# Patient Record
Sex: Female | Born: 1975 | Hispanic: Yes | Marital: Married | Smoking: Never smoker
Health system: Southern US, Community
[De-identification: ages and names within clinical notes are randomized; demographics above are authoritative.]

## PROBLEM LIST (undated history)

## (undated) DIAGNOSIS — E663 Overweight: Principal | ICD-10-CM

## (undated) DIAGNOSIS — N920 Excessive and frequent menstruation with regular cycle: Secondary | ICD-10-CM

## (undated) DIAGNOSIS — K5900 Constipation, unspecified: Secondary | ICD-10-CM

## (undated) DIAGNOSIS — R0602 Shortness of breath: Secondary | ICD-10-CM

## (undated) DIAGNOSIS — Z8619 Personal history of other infectious and parasitic diseases: Secondary | ICD-10-CM

## (undated) DIAGNOSIS — F411 Generalized anxiety disorder: Secondary | ICD-10-CM

## (undated) DIAGNOSIS — Z833 Family history of diabetes mellitus: Secondary | ICD-10-CM

## (undated) DIAGNOSIS — R002 Palpitations: Secondary | ICD-10-CM

## (undated) DIAGNOSIS — R079 Chest pain, unspecified: Secondary | ICD-10-CM

## (undated) DIAGNOSIS — I7300 Raynaud's syndrome without gangrene: Principal | ICD-10-CM

## (undated) DIAGNOSIS — Z0000 Encounter for general adult medical examination without abnormal findings: Secondary | ICD-10-CM

## (undated) DIAGNOSIS — M545 Low back pain: Secondary | ICD-10-CM

## (undated) DIAGNOSIS — S161XXA Strain of muscle, fascia and tendon at neck level, initial encounter: Secondary | ICD-10-CM

## (undated) DIAGNOSIS — E785 Hyperlipidemia, unspecified: Secondary | ICD-10-CM

## (undated) HISTORY — DX: Low back pain: M54.5

## (undated) HISTORY — DX: Strain of muscle, fascia and tendon at neck level, initial encounter: S16.1XXA

## (undated) HISTORY — DX: Raynaud's syndrome without gangrene: I73.00

## (undated) HISTORY — DX: Generalized anxiety disorder: F41.1

## (undated) HISTORY — DX: Family history of diabetes mellitus: Z83.3

## (undated) HISTORY — DX: Encounter for general adult medical examination without abnormal findings: Z00.00

## (undated) HISTORY — DX: Personal history of other infectious and parasitic diseases: Z86.19

## (undated) HISTORY — DX: Excessive and frequent menstruation with regular cycle: N92.0

## (undated) HISTORY — DX: Hyperlipidemia, unspecified: E78.5

## (undated) HISTORY — DX: Overweight: E66.3

## (undated) HISTORY — DX: Chest pain, unspecified: R07.9

## (undated) HISTORY — DX: Constipation, unspecified: K59.00

## (undated) HISTORY — PX: TUBAL LIGATION: SHX77

## (undated) HISTORY — DX: Shortness of breath: R06.02

## (undated) HISTORY — DX: Palpitations: R00.2

---

## 2002-08-16 ENCOUNTER — Other Ambulatory Visit: Admission: RE | Admit: 2002-08-16 | Discharge: 2002-08-16 | Payer: Self-pay | Admitting: *Deleted

## 2003-06-08 ENCOUNTER — Other Ambulatory Visit: Admission: RE | Admit: 2003-06-08 | Discharge: 2003-06-08 | Payer: Self-pay | Admitting: Obstetrics & Gynecology

## 2003-12-04 ENCOUNTER — Inpatient Hospital Stay (HOSPITAL_COMMUNITY): Admission: RE | Admit: 2003-12-04 | Discharge: 2003-12-07 | Payer: Self-pay | Admitting: Obstetrics & Gynecology

## 2003-12-08 ENCOUNTER — Encounter: Admission: RE | Admit: 2003-12-08 | Discharge: 2004-01-07 | Payer: Self-pay | Admitting: Obstetrics & Gynecology

## 2004-12-26 ENCOUNTER — Ambulatory Visit: Payer: Self-pay | Admitting: Internal Medicine

## 2005-05-01 ENCOUNTER — Other Ambulatory Visit: Admission: RE | Admit: 2005-05-01 | Discharge: 2005-05-01 | Payer: Self-pay | Admitting: Otolaryngology

## 2005-05-12 ENCOUNTER — Encounter: Admission: RE | Admit: 2005-05-12 | Discharge: 2005-05-12 | Payer: Self-pay | Admitting: Otolaryngology

## 2005-05-20 ENCOUNTER — Encounter: Admission: RE | Admit: 2005-05-20 | Discharge: 2005-05-20 | Payer: Self-pay | Admitting: Otolaryngology

## 2005-08-23 ENCOUNTER — Emergency Department (HOSPITAL_COMMUNITY): Admission: EM | Admit: 2005-08-23 | Discharge: 2005-08-23 | Payer: Self-pay | Admitting: Family Medicine

## 2005-10-08 ENCOUNTER — Emergency Department (HOSPITAL_COMMUNITY): Admission: EM | Admit: 2005-10-08 | Discharge: 2005-10-08 | Payer: Self-pay | Admitting: Family Medicine

## 2005-10-29 ENCOUNTER — Emergency Department (HOSPITAL_COMMUNITY): Admission: EM | Admit: 2005-10-29 | Discharge: 2005-10-29 | Payer: Self-pay | Admitting: Family Medicine

## 2005-11-14 ENCOUNTER — Emergency Department (HOSPITAL_COMMUNITY): Admission: EM | Admit: 2005-11-14 | Discharge: 2005-11-14 | Payer: Self-pay | Admitting: Family Medicine

## 2007-04-23 ENCOUNTER — Inpatient Hospital Stay (HOSPITAL_COMMUNITY): Admission: AD | Admit: 2007-04-23 | Discharge: 2007-04-23 | Payer: Self-pay | Admitting: *Deleted

## 2007-05-10 ENCOUNTER — Encounter (INDEPENDENT_AMBULATORY_CARE_PROVIDER_SITE_OTHER): Payer: Self-pay | Admitting: Obstetrics & Gynecology

## 2007-05-10 ENCOUNTER — Inpatient Hospital Stay (HOSPITAL_COMMUNITY): Admission: RE | Admit: 2007-05-10 | Discharge: 2007-05-13 | Payer: Self-pay | Admitting: Obstetrics & Gynecology

## 2007-05-14 ENCOUNTER — Encounter: Admission: RE | Admit: 2007-05-14 | Discharge: 2007-06-08 | Payer: Self-pay | Admitting: Obstetrics & Gynecology

## 2008-07-11 ENCOUNTER — Emergency Department (HOSPITAL_COMMUNITY): Admission: EM | Admit: 2008-07-11 | Discharge: 2008-07-11 | Payer: Self-pay | Admitting: Family Medicine

## 2010-07-30 NOTE — Op Note (Signed)
NAMEREBECCA, Casey     ACCOUNT NO.:  [id number]   MEDICAL RECORD NO.:  [id number]          PATIENT TYPE:  INP   LOCATION:  9135                          [facility]  [facility]   PHYSICIAN:  Genia Del, M.D.DATE OF BIRTH:  1975-07-02   DATE OF PROCEDURE:  05/10/2007  DATE OF DISCHARGE:                               OPERATIVE REPORT   PREOPERATIVE DIAGNOSES:  1. Thirty-nine weeks' gestation.  2. Previous cesarean section.  3. Desire for bilateral tubal sterilization.   POSTOPERATIVE DIAGNOSES:  1. Thirty-nine weeks' gestation.  2. Previous cesarean section.  3. Desire for bilateral tubal sterilization.   PROCEDURES:  1. Repeat low transverse cesarean section.  2. Bilateral tubal sterilization by modified Pomeroy technique.   SURGEON:  Genia Del, M.D.   ASSISTANT:  Marlinda Mike, C.N.M.   PROCEDURE:  Under spinal anesthesia, the patient is in 15-degree left  decubitus position.  She is prepped with Betadine on the abdominal,  suprapubic, vulvar and vaginal areas and draped as usual.  The Foley is  put in place.  We verify the level of anesthesia, which is adequate.  We  then infiltrate the skin with Marcaine 0.25% 20 mL total at the site of  the previous scar.  We make a Pfannenstiel incision at that site with a  scalpel.  We open the adipose tissue with a scalpel and then with the  electrocautery.  We open the aponeurosis with the electrocautery and the  Mayo scissors transversely.  We then separate the aponeurosis from the  rectus muscles on the midline superiorly and inferiorly.  We open the  parietal peritoneum longitudinally with [facility] scissors.  We note that the  bladder is very adherent over the lower uterine segment.  We proceed  with meticulous dissection with Metz scissors and lower the bladder this  way.  We then put the bladder retractor in place.  We proceed with a low  transverse hysterotomy with the scalpel.  We extend on each side with  dressing scissors.  The amniotic fluid is clear.  The fetus is in  cephalic presentation.  Birth of a baby girl at 25.  The cord is  clamped and cut.  The baby is given to the neonatal team after  suctioning.  Apgars are 9 and 9.  Weight is 8 pounds 6 ounces.  We then  remove the placenta manually.  The placenta is sent to labor and  delivery after cord blood banking is done.  We do a uterine revision.  We then close the hysterotomy with a full-thickness locked running  suture of Vicryl 0.  We do a second layer to complete hemostasis on the  midline in a mattress fashion with Vicryl 0.  That complete hemostasis.  We then proceed with the bilateral tubal sterilization.  We start on the  right side.  We make a window in the mesosalpinx with the  electrocautery.  We use plain free tie to ligate the tube at about 2 cm  from the cornua and then another tie distally from that.  Was we then  section the portion of the tube in between the two ties with [employer]  and send it to pathology.  We use electrocautery to cauterize  each cut and of the tube.  We proceed exactly the same way on the left  tube.  Hemostasis is adequate at both levels.  We inspect the uterus and  ovaries.  They are normal in size and appearance.  We then irrigate and  suction the abdominopelvic cavities.  We complete hemostasis with the  electrocautery on the recti muscles where necessary.  We then close the  aponeurosis with two half running sutures of Vicryl 0.  The count of  instruments and sponges was complete x2.  We complete hemostasis on the  adipose tissue with the electrocautery and reapproximate the skin with  staples.  A compressive dressing is applied.  The estimated blood loss  was 800 mL.  No complications occurred and the patient was brought to  recovery room in good, stable status.      Genia Del, M.D.  Electronically Signed     ML/MEDQ  D:  05/10/2007  T:  05/11/2007  Job:  [id number]

## 2010-07-30 NOTE — Discharge Summary (Signed)
NAMESHAKOYA, GILMORE     ACCOUNT NO.:  [id number]   MEDICAL RECORD NO.:  [id number]          PATIENT TYPE:  INP   LOCATION:  9135                          [facility]   PHYSICIAN:  Genia Del, M.D.DATE OF BIRTH:  Jan 22, 1976   DATE OF ADMISSION:  05/10/2007  DATE OF DISCHARGE:  05/13/2007                               DISCHARGE SUMMARY   ADMISSION DIAGNOSES:  1. [redacted] weeks gestation.  2. Previous cesarean section.  3. Desire for bilateral tubal sterilization.   DISCHARGE DIAGNOSES:  1. [redacted] weeks gestation.  2. Previous cesarean section.  3. Desire for bilateral tubal sterilization.  4. Birth of a baby girl by cesarean section.   INTERVENTION:  Repeat low-transverse C-section and bilateral tubal  sterilization by modified Pomeroy technique.   PROCEDURE:  No complication.   POSTOPERATIVE EVALUATION:  Normal.  The patient remained afebrile and  hemodynamically stable.  Postop hemoglobin was 10.2 with a hematocrit of  29.  She was discharged on postop day #3 in stable status.  Postop  advice were given.  She will follow up at [facility] in [facility] weeks.  Pain medications were given.      Genia Del, M.D.  Electronically Signed     ML/MEDQ  D:  06/03/2007  T:  06/04/2007  Job:  [id number]

## 2010-07-30 NOTE — H&P (Signed)
Maria Casey, Maria Casey     ACCOUNT NO.:  [id number]   MEDICAL RECORD NO.:  [id number]          PATIENT TYPE:  MAT   LOCATION:  MATC                          [facility]:  [facility]   PHYSICIAN:  [place] B. Earlene Plater, M.D.  DATE OF BIRTH:  [birth year]   DATE OF ADMISSION:  04/23/2007  DATE OF DISCHARGE:                              HISTORY & PHYSICAL   CHIEF COMPLAINT:  Left flank pain.   HISTORY OF PRESENT ILLNESS:  A 35 year old, white female, G3,  P2, 36  weeks presents with approximately 4 hours of intermittent left flank  pain. It would last about 10 minutes at a time, colicky in nature, no  associated urinary symptoms.  No fever, nausea, vomiting, diarrhea or  constipation, good fetal movement.  The patient is unsure if she is  feeling contractions as she has had two previous cesarean sections and  never labored.  Reports good fetal activity.  No vaginal bleeding or  leakage of fluid.   PAST MEDICAL HISTORY:  Fibroids.   PAST SURGICAL HISTORY:  C-section x2.   SOCIAL HISTORY:  Noncontributory.   MEDICATIONS:  Prenatal vitamins.   ALLERGIES:  None.   SOCIAL/FAMILY HISTORY:  Noncontributory.   REVIEW OF SYSTEMS:  Otherwise negative.   PHYSICAL EXAM:  VITAL SIGNS:  Temperature 98.3, blood pressure 111/72,  pulse 96, respirations 20.  GENERAL:  The patient is alert and oriented in no acute distress,  currently free of pain.  The fetal heart rate monitor shows fetal heart  rate baseline in the 140s, moderate variability, no decelerations and  reactive pattern.  There were intermittent contractions when the patient  was initially on the monitor.  At that time, she was having the pain.  At this point, there are no contractions tracing and the patient is free  of pain.  ABDOMEN:  Soft, nontender.  Uterus is nontender.  BACK:  No CVA tenderness.  PELVIC:  Normal external genitalia, vagina normal, cervix is closed,  fixed and effaced and high presenting part.   LABORATORY  ASSESSMENT:  Urinalysis within normal limits.  CBC shows a  white count of 8.5, hemoglobin 13.5, platelets 170. Differentials within  normal limits. Basic metabolic profile within normal limits.  Renal  ultrasound shows normal sized kidneys. No evidence for stones. Bilateral  ureteral jets are seen.   ASSESSMENT:  Left flank pain, possibly the patient was feeling  contractions earlier as she was having pain at that point and  contractions were tracing on the monitor then. At this time, she is  painfree and there are no contractions.  Furthermore, there is no evidence on laboratory or radiological  evaluation for nephrolithiasis, however this is not completely ruled out  as a small stone may have already passed or might not be visualized. The  patient is instructed to monitor symptoms, notify if they recur and  otherwise followup in the office next week.      Gerri Spore B. Earlene Plater, M.D.  Electronically Signed     WBD/MEDQ  D:  04/23/2007  T:  04/24/2007  Job:  [id number]

## 2010-09-24 ENCOUNTER — Other Ambulatory Visit: Payer: Self-pay | Admitting: Obstetrics & Gynecology

## 2010-12-06 LAB — CBC
HCT: 29 — ABNORMAL LOW
HCT: 38.7
Hemoglobin: 10.2 — ABNORMAL LOW
Hemoglobin: 13.6
MCHC: 34.7
MCHC: 35.3
MCV: 92.1
Platelets: 170
Platelets: 114 — ABNORMAL LOW
RBC: 3.13 — ABNORMAL LOW
RBC: 4.2

## 2010-12-06 LAB — DIFFERENTIAL
Basophils Absolute: 0
Eosinophils Relative: 1
Lymphs Abs: 2.8
Monocytes Relative: 6
Neutro Abs: 5.2
Neutrophils Relative %: 61

## 2010-12-06 LAB — URINALYSIS, ROUTINE W REFLEX MICROSCOPIC
Glucose, UA: NEGATIVE
Urobilinogen, UA: 0.2

## 2010-12-06 LAB — BASIC METABOLIC PANEL
BUN: 7
CO2: 27
Calcium: 9.1
Chloride: 98
Creatinine, Ser: 0.5
GFR calc Af Amer: >60
Potassium: 3.9
Sodium: 133 — ABNORMAL LOW

## 2010-12-06 LAB — RPR: RPR Ser Ql: NONREACTIVE

## 2010-12-06 LAB — CCBB MATERNAL DONOR DRAW

## 2011-10-11 ENCOUNTER — Emergency Department (INDEPENDENT_AMBULATORY_CARE_PROVIDER_SITE_OTHER)
Admission: EM | Admit: 2011-10-11 | Discharge: 2011-10-11 | Disposition: A | Discharge status: Home / Self Care | Payer: 59 | Source: Home / Self Care

## 2011-10-11 ENCOUNTER — Encounter: Payer: Self-pay | Admitting: *Deleted

## 2011-10-11 ENCOUNTER — Emergency Department (INDEPENDENT_AMBULATORY_CARE_PROVIDER_SITE_OTHER): Payer: 59

## 2011-10-11 DIAGNOSIS — R109 Unspecified abdominal pain: Secondary | ICD-10-CM

## 2011-10-11 DIAGNOSIS — R21 Rash and other nonspecific skin eruption: Secondary | ICD-10-CM

## 2011-10-11 DIAGNOSIS — B029 Zoster without complications: Secondary | ICD-10-CM

## 2011-10-11 LAB — POCT URINALYSIS DIP (MANUAL ENTRY)
Glucose, UA: NEGATIVE
Leukocytes, UA: NEGATIVE
Protein Ur, POC: NEGATIVE
Urobilinogen, UA: 0.2

## 2011-10-11 MED ORDER — ACYCLOVIR 400 MG PO TABS: 800.0000 mg | ORAL_TABLET | Freq: Every day | ORAL | Status: AC

## 2011-10-11 NOTE — ED Provider Notes (Signed)
History     CSN: [id number]  Arrival date & time 10/11/11  4098   First MD Initiated Contact with Patient 10/11/11 [phone]      Chief Complaint  Patient presents with  . Flank Pain    L  . Rash   HPI R flank pain x 3 days.  Pt also with new onset rash along same distribution.  Rash is mildly itchy. No burning or tingling. No dysuria, nausea, vomiting.  No fevers or chills.  No increased urinary frequency.  No vaginal discharge.  No hx/o STDs.  Flank pain is L sided.  Pt works in surgical ICU.  Pt states that she had pt come in this week with severe case of shingles that she was exposed to for prolonged period of time.  Pt reports chicken pox as a child.    History reviewed. No pertinent past medical history.  Past Surgical History  Procedure Date  . Cesarean section     History reviewed. No pertinent family history.  History  Substance Use Topics  . Smoking status: Never Smoker   . Smokeless tobacco: Not on file  . Alcohol Use: No    OB History    Grav Para Term Preterm Abortions TAB SAB Ect Mult Living                  Review of Systems  All other systems reviewed and are negative.    Allergies  Review of patient's allergies indicates no known allergies.  Home Medications  No current outpatient prescriptions on file.  BP 118/81  Pulse 93  Temp 98.1 F (36.7 C) (Oral)  Resp 16  Ht 5\' 4"  (1.626 m)  Wt 174 lb 8 oz (79.153 kg)  BMI 29.95 kg/m2  SpO2 98%  Physical Exam  Constitutional: She appears well-developed and well-nourished.  HENT:  Head: Normocephalic and atraumatic.  Eyes: Conjunctivae are normal. Pupils are equal, round, and reactive to light.  Neck: Normal range of motion. Neck supple.  Cardiovascular: Normal rate and regular rhythm.   Pulmonary/Chest: Effort normal and breath sounds normal.  Abdominal: Soft. Bowel sounds are normal.      ED Course  Procedures (including critical care time)   Labs Reviewed  POCT URINALYSIS  DIP (MANUAL ENTRY)   Dg Abd 1 View  10/11/2011  *RADIOLOGY REPORT*  Clinical Data: Flank pain, rule out kidney stone  ABDOMEN - 1 VIEW  Comparison: None.  Findings: There is nonspecific nonobstructive bowel gas pattern. Abundant stool noted in the right colon.  Stool and gas noted in transverse and descending colon.  No definite renal or ureteral calcifications are identified.  IMPRESSION: Nonspecific nonobstructive bowel gas pattern.  Stool noted in the right colon.  No definite renal or ureteral calcifications are identified.  Original Report Authenticated By: Natasha Mead, M.D.     No diagnosis found.    MDM  Given history and overall distribution, there is a higher concern for this being shingles.  Will treat with acyclovir for coverage.  KUB and UA negative for GU process.  No GYN red flags today.  Discussed general infectious and abdominal red flags for reevaluation.  Handout given.  Follow up as needed.      The patient and/or caregiver has been counseled thoroughly with regard to treatment plan and/or medications prescribed including dosage, schedule, interactions, rationale for use, and possible side effects and they verbalize understanding. Diagnoses and expected course of recovery discussed and will return if not improved as expected  or if the condition worsens. Patient and/or caregiver verbalized understanding.             Floydene Flock, MD 10/11/11 1100

## 2011-10-11 NOTE — ED Notes (Signed)
Pt woke up 2 days ago with L flank pain described as sharp.  Pain level 2/10 right now.  Rash developed the next day.  Rash is red raised bumps on L side and itches.  Pt also woke up with hand numbness yesterday

## 2011-10-16 NOTE — ED Provider Notes (Signed)
Agree with exam, assessment, and plan.   Stephen A Beese, MD 10/16/11 1535 

## 2012-01-28 ENCOUNTER — Encounter: Payer: Self-pay | Admitting: Cardiovascular Disease

## 2012-01-28 ENCOUNTER — Ambulatory Visit (INDEPENDENT_AMBULATORY_CARE_PROVIDER_SITE_OTHER): Payer: 59 | Admitting: Cardiovascular Disease

## 2012-01-28 VITALS — BP 124/70 | HR 86 | Ht 64.0 in | Wt 172.0 lb

## 2012-01-28 DIAGNOSIS — R002 Palpitations: Secondary | ICD-10-CM

## 2012-01-28 NOTE — Patient Instructions (Addendum)
Your physician recommends that you schedule a follow-up appointment with Dr. Excell Seltzer as needed.  Increase water consumption and decrease caffeine.

## 2012-01-28 NOTE — Progress Notes (Signed)
HPI:  36 year old woman presenting for cardiac evaluation. The patient is self-referred for evaluation of palpitations. She works as a Licensed conveyancer in the surgical ICU. She has noted progressive palpitations over the past few months. She's had episodes where she has to cough in order to relieve her symptoms. She denies any associated symptoms of chest pain or tightness, dyspnea, lightheadedness, or syncope. She drinks lots of coffee. She works full-time and has 3 children ages 51-11. She does not drink much water. She has not engaged in any regular exercise. She denies any exertional symptoms. She specifically denies exertional chest pain or pressure, exertional dyspnea, PND, orthopnea, or calf claudication symptoms. She admits to mild leg swelling at the end of her work shift after 12 hours of sitting.  She underwent previous evaluation for similar symptoms in 2008. I have those records for review. At that time she had a 2-D echocardiogram which was within normal limits. Her left ventricular ejection fraction was normal. There were no valvular abnormalities. EKGs from previous studies were also within normal limits. She underwent an event recorder that demonstrated only sinus rhythm and sinus tachycardia.  No outpatient encounter prescriptions on file as of 01/28/2012.    Review of patient's allergies indicates no known allergies.  Past Medical History  Diagnosis Date  . Palpitations   . SOB (shortness of breath)   . Chest pain     Past Surgical History  Procedure Date  . Cesarean section     History   Social History  . Marital Status: Married    Spouse Name: N/A    Number of Children: N/A  . Years of Education: N/A   Occupational History  . Not on file.   Social History Main Topics  . Smoking status: Never Smoker   . Smokeless tobacco: Not on file  . Alcohol Use: No  . Drug Use: No  . Sexually Active:    Other Topics Concern  . Not on file   Social History Narrative    . No narrative on file    Family History  Problem Relation Age of Onset  . Heart attack Paternal Grandfather     ROS: General: no fevers/chills/night sweats Eyes: no blurry vision, diplopia, or amaurosis ENT: no sore throat or hearing loss Resp: no cough, wheezing, or hemoptysis CV: no edema or palpitations GI: no abdominal pain, nausea, vomiting, diarrhea. Occasional constipation.  GU: no dysuria, frequency, or hematuria Skin: no rash Neuro: no headache, numbness, tingling, or weakness of extremities Musculoskeletal: no joint pain or swelling Heme: no bleeding, DVT, or easy bruising Endo: no polydipsia or polyuria  BP 124/70  Pulse 86  Ht 5\' 4"  (1.626 m)  Wt 78.019 kg (172 lb)  BMI 29.52 kg/m2  SpO2 97%  PHYSICAL EXAM: Pt is alert and oriented, WD, WN, in no distress. HEENT: normal Neck: JVP normal. Carotid upstrokes normal without bruits. No thyromegaly. Lungs: equal expansion, clear bilaterally CV: Apex is discrete and nondisplaced, RRR without murmur or gallop Abd: soft, NT, +BS, no bruit, no hepatosplenomegaly Back: no CVA tenderness Ext: no C/C/E        Femoral pulses 2+= without bruits        DP/PT pulses intact and = Skin: warm and dry without rash Neuro: CNII-XII intact             Strength intact = bilaterally  EKG:  Normal sinus rhythm 91 beats per minute, within normal limits.  ASSESSMENT AND PLAN: Heart palpitations. The patient  has a normal physical exam, history of normal echocardiogram with no evidence of structural heart disease, and a normal 12-lead EKG. There are no ominous signs here. She has undergone past event recording that showed no significant abnormalities. I suspect her increasing palpitations are related to caffeine intake and stress. I encouraged her to reduce caffeine and start drinking decaffeinated coffee. She will push non-caffeinated fluids. We discussed the importance of engaging in regular exercise. We talked about other options  such as further testing or a trial of a beta blocker for symptom management. I don't think further testing is necessary at this point unless her symptoms progress or are refractory to conservative measures as above. She is not inclined to take medication on a regular basis or even as needed at this point. She will call back if further problems.

## 2012-09-08 ENCOUNTER — Telehealth: Payer: Self-pay | Admitting: Cardiovascular Disease

## 2012-09-08 DIAGNOSIS — R002 Palpitations: Secondary | ICD-10-CM

## 2012-09-08 NOTE — Telephone Encounter (Signed)
Returned call to patient she stated she has decreased her caffeine intake to 1 cup coffee daily and she continues to have chest discomfort and frequent palpitations.Stated she would like to have holter monitor.Message sent to Dr.Cooper.

## 2012-09-08 NOTE — Telephone Encounter (Signed)
New problem    Patient would like to be set up for holter monitor.

## 2012-09-08 NOTE — Telephone Encounter (Signed)
Chart reviewed. Since refractory symptoms would recommend a 48 hour Holter. thx

## 2012-09-08 NOTE — Telephone Encounter (Signed)
Spoke to patient Dr.Cooper advised 48 hr holter monitor.Schedulers will call to schedule.

## 2012-09-10 ENCOUNTER — Ambulatory Visit (INDEPENDENT_AMBULATORY_CARE_PROVIDER_SITE_OTHER): Payer: Commercial Managed Care - PPO

## 2012-09-10 DIAGNOSIS — R002 Palpitations: Secondary | ICD-10-CM

## 2012-09-10 NOTE — Progress Notes (Signed)
Placed a 48 hr holter monitor on patient

## 2012-09-16 ENCOUNTER — Ambulatory Visit: Payer: 59 | Admitting: Cardiovascular Disease

## 2012-10-05 NOTE — Progress Notes (Signed)
Spoke with Dr. Malen Gauze regarding pt's history of chest pain and EKG results. Pt does not need pre-op appointment per Dr. Malen Gauze.  Pt instructed as LSD.

## 2012-10-05 NOTE — Anesthesia Preprocedure Evaluation (Addendum)

## 2012-10-11 ENCOUNTER — Encounter (HOSPITAL_COMMUNITY)
Admission: AD | Disposition: A | Discharge status: Home / Self Care | Payer: Self-pay | Source: Ambulatory Visit | Attending: Obstetrics & Gynecology

## 2012-10-11 ENCOUNTER — Other Ambulatory Visit: Payer: Self-pay | Admitting: Obstetrics & Gynecology

## 2012-10-11 ENCOUNTER — Ambulatory Visit (HOSPITAL_COMMUNITY): Payer: Commercial Managed Care - PPO | Admitting: Anesthesiology

## 2012-10-11 ENCOUNTER — Encounter (HOSPITAL_COMMUNITY): Payer: Self-pay | Admitting: Anesthesiology

## 2012-10-11 ENCOUNTER — Encounter (HOSPITAL_COMMUNITY): Payer: Self-pay | Admitting: Pharmacy Technician

## 2012-10-11 ENCOUNTER — Ambulatory Visit (HOSPITAL_COMMUNITY)
Admission: AD | Admit: 2012-10-11 | Discharge: 2012-10-11 | Disposition: A | Discharge status: Home / Self Care | Payer: Commercial Managed Care - PPO | Source: Ambulatory Visit | Attending: Obstetrics & Gynecology | Admitting: Obstetrics & Gynecology

## 2012-10-11 DIAGNOSIS — N921 Excessive and frequent menstruation with irregular cycle: Secondary | ICD-10-CM | POA: Insufficient documentation

## 2012-10-11 DIAGNOSIS — N840 Polyp of corpus uteri: Secondary | ICD-10-CM | POA: Insufficient documentation

## 2012-10-11 HISTORY — PX: DILITATION & CURRETTAGE/HYSTROSCOPY WITH VERSAPOINT RESECTION: SHX5571

## 2012-10-11 LAB — CBC
Hemoglobin: 12.8 g/dL (ref 12.0–15.0)
MCV: 85 fL (ref 78.0–100.0)
Platelets: 199 10*3/uL (ref 150–400)
RBC: 4.52 MIL/uL (ref 3.87–5.11)
WBC: 5.9 10*3/uL (ref 4.0–10.5)

## 2012-10-11 LAB — PREGNANCY, URINE: Preg Test, Ur: NEGATIVE

## 2012-10-11 SURGERY — DILATATION & CURETTAGE/HYSTEROSCOPY WITH VERSAPOINT RESECTION
Anesthesia: General | Site: Vagina | Wound class: Clean Contaminated

## 2012-10-11 MED ORDER — DEXTROSE 5 % IV SOLN: 2.0000 g | INTRAVENOUS | Status: DC

## 2012-10-11 MED ORDER — ONDANSETRON HCL 4 MG/2ML IJ SOLN
INTRAMUSCULAR | Status: AC
  Filled 2012-10-11: qty 2

## 2012-10-11 MED ORDER — LIDOCAINE HCL (CARDIAC) 20 MG/ML IV SOLN
INTRAVENOUS | Status: AC
  Filled 2012-10-11: qty 5

## 2012-10-11 MED ORDER — CHLOROPROCAINE HCL 1 % IJ SOLN
INTRAMUSCULAR | Status: AC
  Filled 2012-10-11: qty 30

## 2012-10-11 MED ORDER — KETOROLAC TROMETHAMINE 30 MG/ML IJ SOLN
INTRAMUSCULAR | Status: AC
  Filled 2012-10-11: qty 1

## 2012-10-11 MED ORDER — KETOROLAC TROMETHAMINE 30 MG/ML IJ SOLN
INTRAMUSCULAR | Status: AC
  Filled 2012-10-11: qty 2

## 2012-10-11 MED ORDER — MIDAZOLAM HCL 2 MG/2ML IJ SOLN
INTRAMUSCULAR | Status: AC
  Filled 2012-10-11: qty 2

## 2012-10-11 MED ORDER — PROPOFOL 10 MG/ML IV BOLUS
INTRAVENOUS | Status: DC | PRN
  Administered 2012-10-11: 180 mg via INTRAVENOUS

## 2012-10-11 MED ORDER — CEFAZOLIN SODIUM-DEXTROSE 2-3 GM-% IV SOLR
2.0000 g | INTRAVENOUS | Status: AC
  Administered 2012-10-11: 2 g via INTRAVENOUS

## 2012-10-11 MED ORDER — CHLOROPROCAINE HCL 1 % IJ SOLN
INTRAMUSCULAR | Status: DC | PRN
  Administered 2012-10-11: 10 mL

## 2012-10-11 MED ORDER — FENTANYL CITRATE 0.05 MG/ML IJ SOLN
INTRAMUSCULAR | Status: DC | PRN
  Administered 2012-10-11 (×2): 50 ug via INTRAVENOUS

## 2012-10-11 MED ORDER — ONDANSETRON HCL 4 MG/2ML IJ SOLN
INTRAMUSCULAR | Status: DC | PRN
  Administered 2012-10-11: 4 mg via INTRAVENOUS

## 2012-10-11 MED ORDER — LIDOCAINE HCL (CARDIAC) 20 MG/ML IV SOLN
INTRAVENOUS | Status: DC | PRN
  Administered 2012-10-11: 30 mg via INTRAVENOUS

## 2012-10-11 MED ORDER — KETOROLAC TROMETHAMINE 30 MG/ML IJ SOLN
INTRAMUSCULAR | Status: DC | PRN
  Administered 2012-10-11 (×2): 30 mg via INTRAVENOUS

## 2012-10-11 MED ORDER — SODIUM CHLORIDE 0.9 % IR SOLN
Status: DC | PRN
  Administered 2012-10-11: 2000 mL

## 2012-10-11 MED ORDER — CEFAZOLIN SODIUM-DEXTROSE 2-3 GM-% IV SOLR
INTRAVENOUS | Status: AC
  Filled 2012-10-11: qty 50

## 2012-10-11 MED ORDER — FENTANYL CITRATE 0.05 MG/ML IJ SOLN: 25.0000 ug | INTRAMUSCULAR | Status: DC | PRN

## 2012-10-11 MED ORDER — MIDAZOLAM HCL 5 MG/5ML IJ SOLN
INTRAMUSCULAR | Status: DC | PRN
  Administered 2012-10-11: 1 mg via INTRAVENOUS

## 2012-10-11 MED ORDER — PROPOFOL 10 MG/ML IV EMUL
INTRAVENOUS | Status: AC
  Filled 2012-10-11: qty 20

## 2012-10-11 MED ORDER — LACTATED RINGERS IV SOLN
INTRAVENOUS | Status: DC
  Administered 2012-10-11 (×2): via INTRAVENOUS

## 2012-10-11 MED ORDER — OXYCODONE-ACETAMINOPHEN 7.5-325 MG PO TABS: 1.0000 | ORAL_TABLET | ORAL | Status: DC | PRN

## 2012-10-11 MED ORDER — FENTANYL CITRATE 0.05 MG/ML IJ SOLN
INTRAMUSCULAR | Status: AC
  Filled 2012-10-11: qty 2

## 2012-10-11 SURGICAL SUPPLY — 17 items
CANISTER SUCTION 2500CC (MISCELLANEOUS) ×2 IMPLANT
CATH ROBINSON RED A/P 16FR (CATHETERS) ×2 IMPLANT
CLOTH BEACON ORANGE TIMEOUT ST (SAFETY) ×2 IMPLANT
CONTAINER PREFILL 10% NBF 60ML (FORM) ×4 IMPLANT
DECANTER SPIKE VIAL GLASS SM (MISCELLANEOUS) ×1 IMPLANT
DRESSING TELFA 8X3 (GAUZE/BANDAGES/DRESSINGS) ×2 IMPLANT
ELECTRODE RT ANGLE VERSAPOINT (CUTTING LOOP) ×1 IMPLANT
GLOVE BIO SURGEON STRL SZ 6.5 (GLOVE) ×2 IMPLANT
GLOVE BIOGEL PI IND STRL 7.0 (GLOVE) ×2 IMPLANT
GLOVE BIOGEL PI INDICATOR 7.0 (GLOVE) ×2
GOWN STRL REIN XL XLG (GOWN DISPOSABLE) ×4 IMPLANT
PACK HYSTEROSCOPY LF (CUSTOM PROCEDURE TRAY) ×2 IMPLANT
PAD OB MATERNITY 4.3X12.25 (PERSONAL CARE ITEMS) ×2 IMPLANT
SUT VIC AB 0 CT1 27 (SUTURE) ×2
SUT VIC AB 0 CT1 27XBRD ANBCTR (SUTURE) IMPLANT
TOWEL OR 17X24 6PK STRL BLUE (TOWEL DISPOSABLE) ×4 IMPLANT
WATER STERILE IRR 1000ML POUR (IV SOLUTION) ×2 IMPLANT

## 2012-10-11 NOTE — Op Note (Signed)
10/11/2012  10:03 AM  PATIENT:  Maria Casey  37 y.o. female  PRE-OPERATIVE DIAGNOSIS:  Endometrial Polyp    POST-OPERATIVE DIAGNOSIS:  Endometrial Polyp    PROCEDURE:  Procedure(s): DILATATION & CURETTAGE/HYSTEROSCOPY WITH VERSAPOINT RESECTION  SURGEON:  Surgeon(s): Genia Del, MD  ASSISTANTS: none   ANESTHESIA:   general  PROCEDURE:  161096  ESTIMATED BLOOD LOSS: 50 cc   Intake/Output Summary (Last 24 hours) at 10/11/12 1003 Last data filed at 10/11/12 0957  Gross per 24 hour  Intake   1000 ml  Output    350 ml  Net    650 ml     BLOOD ADMINISTERED:none   LOCAL MEDICATIONS USED:  MARCAINE     SPECIMEN:  Source of Specimen:  Endometrial polyp and curettings  DISPOSITION OF SPECIMEN:  PATHOLOGY  COUNTS:  YES   PLAN OF CARE: Transfer to PACU  Genia Del MD  10/11/2012  10:06 am

## 2012-10-11 NOTE — H&P (Signed)
Mystery Ramos-Rodriguez is an 37 y.o. female   RP:  Endometrial polyp  Pertinent Gynecological History: Menses: flow is moderate Bleeding: intermenstrual bleeding Blood transfusions: none Sexually transmitted diseases: no past history  Last pap: normal  OB History: Previous C/S   Menstrual History:  No LMP recorded.    Past Medical History  Diagnosis Date  . Palpitations   . SOB (shortness of breath)   . Chest pain     Past Surgical History  Procedure Laterality Date  . Cesarean section      Family History  Problem Relation Age of Onset  . Heart attack Paternal Grandfather     Social History:  reports that she has never smoked. She does not have any smokeless tobacco history on file. She reports that she does not drink alcohol or use illicit drugs.  Allergies: No Known Allergies  No prescriptions prior to admission    There were no vitals taken for this visit.   No results found for this or any previous visit (from the past 24 hour(s)).  No results found.  Assessment/Plan: Endometrial polyp for HSC resection, D+C.  Alesa Echevarria,MARIE-LYNE 10/11/2012, 7:51 AM

## 2012-10-11 NOTE — Anesthesia Postprocedure Evaluation (Signed)
  Anesthesia Post-op Note  Patient: Maria Casey  Procedure(s) Performed: Procedure(s) with comments: DILATATION & CURETTAGE/HYSTEROSCOPY WITH VERSAPOINT RESECTION (N/A) - endometrial polyp Patient is awake and responsive. Pain and nausea are reasonably well controlled. Vital signs are stable and clinically acceptable. Oxygen saturation is clinically acceptable. There are no apparent anesthetic complications at this time. Patient is ready for discharge.

## 2012-10-11 NOTE — Transfer of Care (Signed)
Immediate Anesthesia Transfer of Care Note  Patient: Maria Casey  Procedure(s) Performed: Procedure(s) with comments: DILATATION & CURETTAGE/HYSTEROSCOPY WITH VERSAPOINT RESECTION (N/A) - endometrial polyp  Patient Location: PACU  Anesthesia Type:General  Level of Consciousness: awake, alert  and oriented  Airway & Oxygen Therapy: Patient Spontanous Breathing and Patient connected to nasal cannula oxygen  Post-op Assessment: Report given to PACU RN and Post -op Vital signs reviewed and stable  Post vital signs: Reviewed and stable  Complications: No apparent anesthesia complications

## 2012-10-11 NOTE — OR Nursing (Signed)
Updated equipment.  Dr. Seymour Bars used a versapoint.

## 2012-10-11 NOTE — Op Note (Signed)
NAMERABECCA, Casey     ACCOUNT NO.:  [id number]  MEDICAL RECORD NO.:  [id number]  LOCATION:  [facility]                          [facility]  PHYSICIAN:  Genia Del, M.D.DATE OF BIRTH:  [birth year]  DATE OF PROCEDURE:  10/11/2012 DATE OF DISCHARGE:  10/11/2012                              OPERATIVE REPORT   PREOPERATIVE DIAGNOSIS:  Endometrial polyp.  POSTOPERATIVE DIAGNOSIS:  Endometrial polyp.  INTERVENTION:  Hysteroscopy with resection of polyp using VersaPoint and D and C.  SURGEON:  Genia Del, M.D.  ASSISTANT:  No assistant.  ANESTHESIA:  General anesthesia.  PROCEDURE:  Under general anesthesia with laryngeal mask, the patient was in lithotomy position.  She was prepped with Betadine on the suprapubic, vulvar and vaginal areas.  She was draped as usual.  The vaginal exam revealed an anteverted uterus, normal volume, no adnexal mass.  The speculum was inserted in the vagina and the anterior lip of the cervix was grasped with a tenaculum.  Paracervical block done with Nesacaine 2%, a total of 20 mL at 4 and 8 o'clock.  Dilation of the cervix with Hegar dilators up to a #31 without difficulty.  We then inserted the hysteroscope in the intrauterine cavity.  Both ostia were well visualized.  The cavity was normal, except for a posterior polyp measuring about 2 cm in diameter.  Resection of the endometrial polyp with the loop using VersaPoint instrument.  The polyp was completely resected and removed from the intrauterine cavity.  Pictures were taken before and after the procedure.  We then removed the hysteroscope after assuring that hemostasis was adequate.  We proceeded with a curettage of the endometrial cavity on all surfaces with a sharp curette.  The specimen was sent together with the resection specimen.  All instruments were removed.  A small tear was present on the anterior lip of the cervix, which was repaired with a figure-of-eight  Vicryl-0.  Hemostasis was adequate at that level as well.  The speculum was removed.  The estimated blood loss was 50 mL.  The patient was brought to recovery room in good and stable status.  The count of instruments and sponge was complete.     Genia Del, M.D.     ML/MEDQ  D:  10/11/2012  T:  10/11/2012  Job:  [id number]

## 2012-10-11 NOTE — Discharge Summary (Signed)
  Physician Discharge Summary  Patient ID: Maria Casey MRN: [id number] DOB/AGE: Sep 24, 1975 37 y.o.  Admit date: 10/11/2012 Discharge date: 10/11/2012  Admission Diagnoses: Endometrial Polyp  58558  Discharge Diagnoses: Endometrial Polyp  40981        Active Problems:   * No active hospital problems. *   Discharged Condition: good  Hospital Course: Outpatient  Consults: None  Treatments: surgery: Hysteroscopy with resection of polyp with Versapoint, D+C  Disposition: 01-Home or Self Care   Future Appointments Provider Department Dept Phone   10/21/2012 2:30 PM Bradd Canary, MD [facility] at  [facility] [phone]   11/17/2012 3:15 PM Tonny Bollman, MD [facility] Main Office [place]) [phone]       Medication List         oxyCODONE-acetaminophen 7.5-325 MG per tablet  Commonly known as:  PERCOCET  Take 1 tablet by mouth every 4 (four) hours as needed for pain.           Follow-up Information   Follow up with Zakeria Kulzer,MARIE-LYNE, MD In 3 weeks.   Contact information:   [address] [place] [address] [phone]       Signed: Genia Del, MD 10/11/2012, 10:16 AM

## 2012-10-12 ENCOUNTER — Encounter (HOSPITAL_COMMUNITY): Payer: Self-pay | Admitting: Obstetrics & Gynecology

## 2012-10-21 ENCOUNTER — Encounter: Payer: Self-pay | Admitting: Family Medicine

## 2012-10-21 ENCOUNTER — Ambulatory Visit (INDEPENDENT_AMBULATORY_CARE_PROVIDER_SITE_OTHER): Payer: Commercial Managed Care - PPO | Admitting: Family Medicine

## 2012-10-21 VITALS — BP 102/76 | HR 73 | Temp 98.2°F | Resp 16 | Ht 65.0 in | Wt 170.0 lb

## 2012-10-21 DIAGNOSIS — E785 Hyperlipidemia, unspecified: Secondary | ICD-10-CM

## 2012-10-21 DIAGNOSIS — R202 Paresthesia of skin: Secondary | ICD-10-CM

## 2012-10-21 DIAGNOSIS — Z0000 Encounter for general adult medical examination without abnormal findings: Secondary | ICD-10-CM

## 2012-10-21 DIAGNOSIS — R002 Palpitations: Secondary | ICD-10-CM

## 2012-10-21 DIAGNOSIS — F411 Generalized anxiety disorder: Secondary | ICD-10-CM

## 2012-10-21 DIAGNOSIS — R209 Unspecified disturbances of skin sensation: Secondary | ICD-10-CM

## 2012-10-21 DIAGNOSIS — N920 Excessive and frequent menstruation with regular cycle: Secondary | ICD-10-CM

## 2012-10-21 LAB — RENAL FUNCTION PANEL
Albumin: 4.5 g/dL (ref 3.5–5.2)
BUN: 9 mg/dL (ref 6–23)
CO2: 30 mEq/L (ref 19–32)
Creat: 0.74 mg/dL (ref 0.50–1.10)
Glucose, Bld: 80 mg/dL (ref 70–99)
Sodium: 138 mEq/L (ref 135–145)

## 2012-10-21 LAB — CBC
HCT: 38.8 % (ref 36.0–46.0)
Hemoglobin: 13.2 g/dL (ref 12.0–15.0)
MCH: 28.3 pg (ref 26.0–34.0)
MCHC: 34 g/dL (ref 30.0–36.0)
MCV: 83.1 fL (ref 78.0–100.0)
RBC: 4.67 MIL/uL (ref 3.87–5.11)

## 2012-10-21 LAB — HEPATIC FUNCTION PANEL
AST: 16 U/L (ref 0–37)
Albumin: 4.5 g/dL (ref 3.5–5.2)
Total Bilirubin: 0.4 mg/dL (ref 0.3–1.2)

## 2012-10-21 LAB — LIPID PANEL
HDL: 41 mg/dL (ref >39)
Total CHOL/HDL Ratio: 4.4 Ratio
VLDL: 24 mg/dL (ref 0–40)

## 2012-10-21 LAB — T4, FREE: Free T4: 1.19 ng/dL (ref 0.80–1.80)

## 2012-10-21 LAB — VITAMIN B12: Vitamin B-12: 565 pg/mL (ref 211–911)

## 2012-10-21 MED ORDER — ALPRAZOLAM 0.25 MG PO TABS: 0.2500 mg | ORAL_TABLET | Freq: Two times a day (BID) | ORAL | Status: DC | PRN

## 2012-10-21 NOTE — Patient Instructions (Addendum)
Preventive Care for Adults, Female A healthy lifestyle and preventive care can promote health and wellness. Preventive health guidelines for women include the following key practices.  A routine yearly physical is a good way to check with your caregiver about your health and preventive screening. It is a chance to share any concerns and updates on your health, and to receive a thorough exam.  Visit your dentist for a routine exam and preventive care every 6 months. Brush your teeth twice a day and floss once a day. Good oral hygiene prevents tooth decay and gum disease.  The frequency of eye exams is based on your age, health, family medical history, use of contact lenses, and other factors. Follow your caregiver's recommendations for frequency of eye exams.  Eat a healthy diet. Foods like vegetables, fruits, whole grains, low-fat dairy products, and lean protein foods contain the nutrients you need without too many calories. Decrease your intake of foods high in solid fats, added sugars, and salt. Eat the right amount of calories for you.Get information about a proper diet from your caregiver, if necessary.  Regular physical exercise is one of the most important things you can do for your health. Most adults should get at least 150 minutes of moderate-intensity exercise (any activity that increases your heart rate and causes you to sweat) each week. In addition, most adults need muscle-strengthening exercises on 2 or more days a week.  Maintain a healthy weight. The body mass index (BMI) is a screening tool to identify possible weight problems. It provides an estimate of body fat based on height and weight. Your caregiver can help determine your BMI, and can help you achieve or maintain a healthy weight.For adults 20 years and older:  A BMI below 18.5 is considered underweight.  A BMI of 18.5 to 24.9 is normal.  A BMI of 25 to 29.9 is considered overweight.  A BMI of 30 and above is  considered obese.  Maintain normal blood lipids and cholesterol levels by exercising and minimizing your intake of saturated fat. Eat a balanced diet with plenty of fruit and vegetables. Blood tests for lipids and cholesterol should begin at age 20 and be repeated every 5 years. If your lipid or cholesterol levels are high, you are over 50, or you are at high risk for heart disease, you may need your cholesterol levels checked more frequently.Ongoing high lipid and cholesterol levels should be treated with medicines if diet and exercise are not effective.  If you smoke, find out from your caregiver how to quit. If you do not use tobacco, do not start.  If you are pregnant, do not drink alcohol. If you are breastfeeding, be very cautious about drinking alcohol. If you are not pregnant and choose to drink alcohol, do not exceed 1 drink per day. One drink is considered to be 12 ounces (355 mL) of beer, 5 ounces (148 mL) of wine, or 1.5 ounces (44 mL) of liquor.  Avoid use of street drugs. Do not share needles with anyone. Ask for help if you need support or instructions about stopping the use of drugs.  High blood pressure causes heart disease and increases the risk of stroke. Your blood pressure should be checked at least every 1 to 2 years. Ongoing high blood pressure should be treated with medicines if weight loss and exercise are not effective.  If you are 55 to 37 years old, ask your caregiver if you should take aspirin to prevent strokes.  Diabetes   screening involves taking a blood sample to check your fasting blood sugar level. This should be done once every 3 years, after age 45, if you are within normal weight and without risk factors for diabetes. Testing should be considered at a younger age or be carried out more frequently if you are overweight and have at least 1 risk factor for diabetes.  Breast cancer screening is essential preventive care for women. You should practice "breast  self-awareness." This means understanding the normal appearance and feel of your breasts and may include breast self-examination. Any changes detected, no matter how small, should be reported to a caregiver. Women in their 20s and 30s should have a clinical breast exam (CBE) by a caregiver as part of a regular health exam every 1 to 3 years. After age 40, women should have a CBE every year. Starting at age 40, women should consider having a mammography (breast X-ray test) every year. Women who have a family history of breast cancer should talk to their caregiver about genetic screening. Women at a high risk of breast cancer should talk to their caregivers about having magnetic resonance imaging (MRI) and a mammography every year.  The Pap test is a screening test for cervical cancer. A Pap test can show cell changes on the cervix that might become cervical cancer if left untreated. A Pap test is a procedure in which cells are obtained and examined from the lower end of the uterus (cervix).  Women should have a Pap test starting at age 21.  Between ages 21 and 29, Pap tests should be repeated every 2 years.  Beginning at age 30, you should have a Pap test every 3 years as long as the past 3 Pap tests have been normal.  Some women have medical problems that increase the chance of getting cervical cancer. Talk to your caregiver about these problems. It is especially important to talk to your caregiver if a new problem develops soon after your last Pap test. In these cases, your caregiver may recommend more frequent screening and Pap tests.  The above recommendations are the same for women who have or have not gotten the vaccine for human papillomavirus (HPV).  If you had a hysterectomy for a problem that was not cancer or a condition that could lead to cancer, then you no longer need Pap tests. Even if you no longer need a Pap test, a regular exam is a good idea to make sure no other problems are  starting.  If you are between ages 65 and 70, and you have had normal Pap tests going back 10 years, you no longer need Pap tests. Even if you no longer need a Pap test, a regular exam is a good idea to make sure no other problems are starting.  If you have had past treatment for cervical cancer or a condition that could lead to cancer, you need Pap tests and screening for cancer for at least 20 years after your treatment.  If Pap tests have been discontinued, risk factors (such as a new sexual partner) need to be reassessed to determine if screening should be resumed.  The HPV test is an additional test that may be used for cervical cancer screening. The HPV test looks for the virus that can cause the cell changes on the cervix. The cells collected during the Pap test can be tested for HPV. The HPV test could be used to screen women aged 30 years and older, and should   be used in women of any age who have unclear Pap test results. After the age of 30, women should have HPV testing at the same frequency as a Pap test.  Colorectal cancer can be detected and often prevented. Most routine colorectal cancer screening begins at the age of 50 and continues through age 75. However, your caregiver may recommend screening at an earlier age if you have risk factors for colon cancer. On a yearly basis, your caregiver may provide home test kits to check for hidden blood in the stool. Use of a small camera at the end of a tube, to directly examine the colon (sigmoidoscopy or colonoscopy), can detect the earliest forms of colorectal cancer. Talk to your caregiver about this at age 50, when routine screening begins. Direct examination of the colon should be repeated every 5 to 10 years through age 75, unless early forms of pre-cancerous polyps or small growths are found.  Hepatitis C blood testing is recommended for all people born from 1945 through 1965 and any individual with known risks for hepatitis C.  Practice  safe sex. Use condoms and avoid high-risk sexual practices to reduce the spread of sexually transmitted infections (STIs). STIs include gonorrhea, chlamydia, syphilis, trichomonas, herpes, HPV, and human immunodeficiency virus (HIV). Herpes, HIV, and HPV are viral illnesses that have no cure. They can result in disability, cancer, and death. Sexually active women aged 25 and younger should be checked for chlamydia. Older women with new or multiple partners should also be tested for chlamydia. Testing for other STIs is recommended if you are sexually active and at increased risk.  Osteoporosis is a disease in which the bones lose minerals and strength with aging. This can result in serious bone fractures. The risk of osteoporosis can be identified using a bone density scan. Women ages 65 and over and women at risk for fractures or osteoporosis should discuss screening with their caregivers. Ask your caregiver whether you should take a calcium supplement or vitamin D to reduce the rate of osteoporosis.  Menopause can be associated with physical symptoms and risks. Hormone replacement therapy is available to decrease symptoms and risks. You should talk to your caregiver about whether hormone replacement therapy is right for you.  Use sunscreen with sun protection factor (SPF) of 30 or more. Apply sunscreen liberally and repeatedly throughout the day. You should seek shade when your shadow is shorter than you. Protect yourself by wearing long sleeves, pants, a wide-brimmed hat, and sunglasses year round, whenever you are outdoors.  Once a month, do a whole body skin exam, using a mirror to look at the skin on your back. Notify your caregiver of new moles, moles that have irregular borders, moles that are larger than a pencil eraser, or moles that have changed in shape or color.  Stay current with required immunizations.  Influenza. You need a dose every fall (or winter). The composition of the flu vaccine  changes each year, so being vaccinated once is not enough.  Pneumococcal polysaccharide. You need 1 to 2 doses if you smoke cigarettes or if you have certain chronic medical conditions. You need 1 dose at age 65 (or older) if you have never been vaccinated.  Tetanus, diphtheria, pertussis (Tdap, Td). Get 1 dose of Tdap vaccine if you are younger than age 65, are over 65 and have contact with an infant, are a healthcare worker, are pregnant, or simply want to be protected from whooping cough. After that, you need a Td   booster dose every 10 years. Consult your caregiver if you have not had at least 3 tetanus and diphtheria-containing shots sometime in your life or have a deep or dirty wound.  HPV. You need this vaccine if you are a woman age 26 or younger. The vaccine is given in 3 doses over 6 months.  Measles, mumps, rubella (MMR). You need at least 1 dose of MMR if you were born in 1957 or later. You may also need a second dose.  Meningococcal. If you are age 19 to 21 and a first-year college student living in a residence hall, or have one of several medical conditions, you need to get vaccinated against meningococcal disease. You may also need additional booster doses.  Zoster (shingles). If you are age 60 or older, you should get this vaccine.  Varicella (chickenpox). If you have never had chickenpox or you were vaccinated but received only 1 dose, talk to your caregiver to find out if you need this vaccine.  Hepatitis A. You need this vaccine if you have a specific risk factor for hepatitis A virus infection or you simply wish to be protected from this disease. The vaccine is usually given as 2 doses, 6 to 18 months apart.  Hepatitis B. You need this vaccine if you have a specific risk factor for hepatitis B virus infection or you simply wish to be protected from this disease. The vaccine is given in 3 doses, usually over 6 months. Preventive Services / Frequency Ages 19 to 39  Blood  pressure check.** / Every 1 to 2 years.  Lipid and cholesterol check.** / Every 5 years beginning at age 20.  Clinical breast exam.** / Every 3 years for women in their 20s and 30s.  Pap test.** / Every 2 years from ages 21 through 29. Every 3 years starting at age 30 through age 65 or 70 with a history of 3 consecutive normal Pap tests.  HPV screening.** / Every 3 years from ages 30 through ages 65 to 70 with a history of 3 consecutive normal Pap tests.  Hepatitis C blood test.** / For any individual with known risks for hepatitis C.  Skin self-exam. / Monthly.  Influenza immunization.** / Every year.  Pneumococcal polysaccharide immunization.** / 1 to 2 doses if you smoke cigarettes or if you have certain chronic medical conditions.  Tetanus, diphtheria, pertussis (Tdap, Td) immunization. / A one-time dose of Tdap vaccine. After that, you need a Td booster dose every 10 years.  HPV immunization. / 3 doses over 6 months, if you are 26 and younger.  Measles, mumps, rubella (MMR) immunization. / You need at least 1 dose of MMR if you were born in 1957 or later. You may also need a second dose.  Meningococcal immunization. / 1 dose if you are age 19 to 21 and a first-year college student living in a residence hall, or have one of several medical conditions, you need to get vaccinated against meningococcal disease. You may also need additional booster doses.  Varicella immunization.** / Consult your caregiver.  Hepatitis A immunization.** / Consult your caregiver. 2 doses, 6 to 18 months apart.  Hepatitis B immunization.** / Consult your caregiver. 3 doses usually over 6 months. Ages 40 to 64  Blood pressure check.** / Every 1 to 2 years.  Lipid and cholesterol check.** / Every 5 years beginning at age 20.  Clinical breast exam.** / Every year after age 40.  Mammogram.** / Every year beginning at age 40   and continuing for as long as you are in good health. Consult with your  caregiver.  Pap test.** / Every 3 years starting at age 30 through age 65 or 70 with a history of 3 consecutive normal Pap tests.  HPV screening.** / Every 3 years from ages 30 through ages 65 to 70 with a history of 3 consecutive normal Pap tests.  Fecal occult blood test (FOBT) of stool. / Every year beginning at age 50 and continuing until age 75. You may not need to do this test if you get a colonoscopy every 10 years.  Flexible sigmoidoscopy or colonoscopy.** / Every 5 years for a flexible sigmoidoscopy or every 10 years for a colonoscopy beginning at age 50 and continuing until age 75.  Hepatitis C blood test.** / For all people born from 1945 through 1965 and any individual with known risks for hepatitis C.  Skin self-exam. / Monthly.  Influenza immunization.** / Every year.  Pneumococcal polysaccharide immunization.** / 1 to 2 doses if you smoke cigarettes or if you have certain chronic medical conditions.  Tetanus, diphtheria, pertussis (Tdap, Td) immunization.** / A one-time dose of Tdap vaccine. After that, you need a Td booster dose every 10 years.  Measles, mumps, rubella (MMR) immunization. / You need at least 1 dose of MMR if you were born in 1957 or later. You may also need a second dose.  Varicella immunization.** / Consult your caregiver.  Meningococcal immunization.** / Consult your caregiver.  Hepatitis A immunization.** / Consult your caregiver. 2 doses, 6 to 18 months apart.  Hepatitis B immunization.** / Consult your caregiver. 3 doses, usually over 6 months. Ages 65 and over  Blood pressure check.** / Every 1 to 2 years.  Lipid and cholesterol check.** / Every 5 years beginning at age 20.  Clinical breast exam.** / Every year after age 40.  Mammogram.** / Every year beginning at age 40 and continuing for as long as you are in good health. Consult with your caregiver.  Pap test.** / Every 3 years starting at age 30 through age 65 or 70 with a 3  consecutive normal Pap tests. Testing can be stopped between 65 and 70 with 3 consecutive normal Pap tests and no abnormal Pap or HPV tests in the past 10 years.  HPV screening.** / Every 3 years from ages 30 through ages 65 or 70 with a history of 3 consecutive normal Pap tests. Testing can be stopped between 65 and 70 with 3 consecutive normal Pap tests and no abnormal Pap or HPV tests in the past 10 years.  Fecal occult blood test (FOBT) of stool. / Every year beginning at age 50 and continuing until age 75. You may not need to do this test if you get a colonoscopy every 10 years.  Flexible sigmoidoscopy or colonoscopy.** / Every 5 years for a flexible sigmoidoscopy or every 10 years for a colonoscopy beginning at age 50 and continuing until age 75.  Hepatitis C blood test.** / For all people born from 1945 through 1965 and any individual with known risks for hepatitis C.  Osteoporosis screening.** / A one-time screening for women ages 65 and over and women at risk for fractures or osteoporosis.  Skin self-exam. / Monthly.  Influenza immunization.** / Every year.  Pneumococcal polysaccharide immunization.** / 1 dose at age 65 (or older) if you have never been vaccinated.  Tetanus, diphtheria, pertussis (Tdap, Td) immunization. / A one-time dose of Tdap vaccine if you are over   65 and have contact with an infant, are a Research scientist (physical sciences), or simply want to be protected from whooping cough. After that, you need a Td booster dose every 10 years.  Varicella immunization.** / Consult your caregiver.  Meningococcal immunization.** / Consult your caregiver.  Hepatitis A immunization.** / Consult your caregiver. 2 doses, 6 to 18 months apart.  Hepatitis B immunization.** / Check with your caregiver. 3 doses, usually over 6 months. ** Family history and personal history of risk and conditions may change your caregiver's recommendations. Document Released: 04/29/2001 Document Revised: 05/26/2011  Document Reviewed: 07/29/2010 [facility] Patient Information 2014 [place], [place]. Anxiety and Panic Attacks Your caregiver has informed you that you are having an anxiety or panic attack. There may be many forms of this. Most of the time these attacks come suddenly and without warning. They come at any time of day, including periods of sleep, and at any time of life. They may be strong and unexplained. Although panic attacks are very scary, they are physically harmless. Sometimes the cause of your anxiety is not known. Anxiety is a protective mechanism of the body in its fight or flight mechanism. Most of these perceived danger situations are actually nonphysical situations (such as anxiety over losing a job). CAUSES  The causes of an anxiety or panic attack are many. Panic attacks may occur in otherwise healthy people given a certain set of circumstances. There may be a genetic cause for panic attacks. Some medications may also have anxiety as a side effect. SYMPTOMS  Some of the most common feelings are:  Intense terror.  Dizziness, feeling faint.  Hot and cold flashes.  Fear of going crazy.  Feelings that nothing is real.  Sweating.  Shaking.  Chest pain or a fast heartbeat (palpitations).  Smothering, choking sensations.  Feelings of impending doom and that death is near.  Tingling of extremities, this may be from over-breathing.  Altered reality (derealization).  Being detached from yourself (depersonalization). Several symptoms can be present to make up anxiety or panic attacks. DIAGNOSIS  The evaluation by your caregiver will depend on the type of symptoms you are experiencing. The diagnosis of anxiety or panic attack is made when no physical illness can be determined to be a cause of the symptoms. TREATMENT  Treatment to prevent anxiety and panic attacks may include:  Avoidance of circumstances that cause anxiety.  Reassurance and relaxation.  Regular  exercise.  Relaxation therapies, such as yoga.  Psychotherapy with a psychiatrist or therapist.  Avoidance of caffeine, alcohol and illegal drugs.  Prescribed medication. SEEK IMMEDIATE MEDICAL CARE IF:   You experience panic attack symptoms that are different than your usual symptoms.  You have any worsening or concerning symptoms. Document Released: 03/03/2005 Document Revised: 05/26/2011 Document Reviewed: 07/05/2009 [facility] Patient Information 2014 [place], [place].

## 2012-10-21 NOTE — Progress Notes (Signed)
Patient ID: Maria Casey, female   DOB: June 22, 1975, 37 y.o.   MRN: [id number] Maria Casey [id number] Aug 07, 1975 10/21/2012      Progress Note New Patient  Subjective  Chief Complaint  Chief Complaint  Patient presents with  . Establish Care    Pt new to establish care, non-fasting today. Wants to discuss cardiac findings. Thinks last tetanus was 2006, last pap smear 09/2012, last mammogram 07/2011 (normal per pt).    HPI  Patient is a 37 year old female who is in today for new patient appointment. Her biggest complaint is palpitations she's had them for quite some time but recently they have increased in intensity. She's had a significant cardiac workup that so far has been negative. She acknowledges being under a great deal of stress with children at home and manages her was in school and working. The palpitations tend to occur at bedtime usually last only seconds. She has had episodes in the past but it had chest tightness shortness of breath with them. She does describe some numbness in her fingertips during these episodes. She D&C a couple of weeks ago but denies any abdominal pain GI or GU complaints. Her Holter monitor in the past that was unremarkable.  Past Medical History  Diagnosis Date  . Palpitations   . SOB (shortness of breath)   . Chest pain   . History of chicken pox     Past Surgical History  Procedure Laterality Date  . Dilitation & currettage/hystroscopy with versapoint resection N/A 10/11/2012    Procedure: DILATATION & CURETTAGE/HYSTEROSCOPY WITH VERSAPOINT RESECTION;  Surgeon: Genia Del, MD;  Location: [facility];  Service: Gynecology;  Laterality: N/A;  endometrial polyp  . Tubal ligation    . Cesarean section      x 3    Family History  Problem Relation Age of Onset  . Heart attack Paternal Grandfather   . Hypertension Father   . Depression Sister   . Heart disease Son     coarctation of aorta repaired, Ross procedure, pulmonary  graft, VSD repair    History   Social History  . Marital Status: Married    Spouse Name: N/A    Number of Children: N/A  . Years of Education: N/A   Occupational History  . Not on file.   Social History Main Topics  . Smoking status: Never Smoker   . Smokeless tobacco: Not on file  . Alcohol Use: No  . Drug Use: No  . Sexually Active: Yes   Other Topics Concern  . Not on file   Social History Narrative  . No narrative on file    Current Outpatient Prescriptions on File Prior to Visit  Medication Sig Dispense Refill  . oxyCODONE-acetaminophen (PERCOCET) 7.5-325 MG per tablet Take 1 tablet by mouth every 4 (four) hours as needed for pain.  20 tablet  0   No current facility-administered medications on file prior to visit.    No Known Allergies  Review of Systems  Review of Systems  Constitutional: Positive for malaise/fatigue. Negative for fever and chills.  HENT: Negative for hearing loss, nosebleeds and congestion.   Eyes: Negative for discharge.  Respiratory: Positive for shortness of breath. Negative for cough, sputum production and wheezing.   Cardiovascular: Positive for palpitations. Negative for chest pain and leg swelling.  Gastrointestinal: Negative for heartburn, nausea, vomiting, abdominal pain, diarrhea, constipation and blood in stool.  Genitourinary: Negative for dysuria, urgency, frequency and hematuria.  Musculoskeletal: Negative for myalgias, back pain and  falls.  Skin: Negative for rash.  Neurological: Negative for dizziness, tremors, sensory change, focal weakness, loss of consciousness, weakness and headaches.  Endo/Heme/Allergies: Negative for polydipsia. Does not bruise/bleed easily.  Psychiatric/Behavioral: Negative for depression and suicidal ideas. The patient is nervous/anxious. The patient does not have insomnia.     Objective  BP 102/76  Pulse 73  Temp(Src) 98.2 F (36.8 C) (Oral)  Resp 16  Ht 5\' 5"  (1.651 m)  Wt 170 lb (77.111  kg)  BMI 28.29 kg/m2  SpO2 99%  LMP 10/18/2012  Physical Exam  Physical Exam  Constitutional: She is oriented to person, place, and time and well-developed, well-nourished, and in no distress. No distress.  HENT:  Head: Normocephalic and atraumatic.  Right Ear: External ear normal.  Left Ear: External ear normal.  Nose: Nose normal.  Mouth/Throat: Oropharynx is clear and moist. No oropharyngeal exudate.  Eyes: Conjunctivae are normal. Pupils are equal, round, and reactive to light. Right eye exhibits no discharge. Left eye exhibits no discharge. No scleral icterus.  Neck: Normal range of motion. Neck supple. No thyromegaly present.  Cardiovascular: Normal rate, regular rhythm, normal heart sounds and intact distal pulses.   No murmur heard. Pulmonary/Chest: Effort normal and breath sounds normal. No respiratory distress. She has no wheezes. She has no rales.  Abdominal: Soft. Bowel sounds are normal. She exhibits no distension and no mass. There is no tenderness.  Musculoskeletal: Normal range of motion. She exhibits no edema and no tenderness.  Lymphadenopathy:    She has no cervical adenopathy.  Neurological: She is alert and oriented to person, place, and time. She has normal reflexes. No cranial nerve deficit. Coordination normal.  Skin: Skin is warm and dry. No rash noted. She is not diaphoretic.  Psychiatric: Mood, memory and affect normal.       Assessment & Plan  Other and unspecified hyperlipidemia Mild avoid trans fats, increase exercise, add krill oil caps  Anxiety state, unspecified Discussed possibility of anxiety and stress playing a major role in her palpitations. She is given a prescription of Alprazolam to try if she has another episode. Discussed the possibility of starting a dialy SSRI if symptoms persistent.   Menorrhagia Had a  D&C 2 weeks ago  Heart palpitations Previous work up was unremarkable with EKG and Echo. Presently palpitations are short  lived and occur at bedtime. Has decreased her caffeine intake. May need further consideration if symptoms worsen or Alprazolam not helpful  Preventative health care Fasting labs reviewed, encouraged Stress reduction, regular exercise, increased sleep and heart healthy diet.

## 2012-10-24 ENCOUNTER — Encounter: Payer: Self-pay | Admitting: Family Medicine

## 2012-10-24 DIAGNOSIS — Z0000 Encounter for general adult medical examination without abnormal findings: Secondary | ICD-10-CM

## 2012-10-24 DIAGNOSIS — N920 Excessive and frequent menstruation with regular cycle: Secondary | ICD-10-CM

## 2012-10-24 DIAGNOSIS — E785 Hyperlipidemia, unspecified: Secondary | ICD-10-CM

## 2012-10-24 DIAGNOSIS — F411 Generalized anxiety disorder: Secondary | ICD-10-CM

## 2012-10-24 HISTORY — DX: Hyperlipidemia, unspecified: E78.5

## 2012-10-24 HISTORY — DX: Generalized anxiety disorder: F41.1

## 2012-10-24 HISTORY — DX: Encounter for general adult medical examination without abnormal findings: Z00.00

## 2012-10-24 HISTORY — DX: Excessive and frequent menstruation with regular cycle: N92.0

## 2012-10-24 NOTE — Assessment & Plan Note (Signed)
Previous work up was unremarkable with EKG and Echo. Presently palpitations are short lived and occur at bedtime. Has decreased her caffeine intake. May need further consideration if symptoms worsen or Alprazolam not helpful

## 2012-10-24 NOTE — Assessment & Plan Note (Signed)
Had a D&C 2 weeks ago.

## 2012-10-24 NOTE — Assessment & Plan Note (Signed)
Fasting labs reviewed, encouraged Stress reduction, regular exercise, increased sleep and heart healthy diet.

## 2012-10-24 NOTE — Assessment & Plan Note (Signed)
Mild avoid trans fats, increase exercise, add krill oil caps 

## 2012-10-24 NOTE — Assessment & Plan Note (Signed)
Discussed possibility of anxiety and stress playing a major role in her palpitations. She is given a prescription of Alprazolam to try if she has another episode. Discussed the possibility of starting a dialy SSRI if symptoms persistent.

## 2012-11-17 ENCOUNTER — Ambulatory Visit: Payer: 59 | Admitting: Cardiovascular Disease

## 2012-11-26 ENCOUNTER — Telehealth: Payer: Self-pay | Admitting: Family Medicine

## 2012-11-26 ENCOUNTER — Encounter: Payer: Self-pay | Admitting: Family Medicine

## 2012-11-26 ENCOUNTER — Ambulatory Visit (INDEPENDENT_AMBULATORY_CARE_PROVIDER_SITE_OTHER): Payer: Commercial Managed Care - PPO | Admitting: Family Medicine

## 2012-11-26 VITALS — BP 102/72 | HR 76 | Temp 98.0°F | Ht 64.0 in | Wt 172.0 lb

## 2012-11-26 DIAGNOSIS — S161XXD Strain of muscle, fascia and tendon at neck level, subsequent encounter: Secondary | ICD-10-CM

## 2012-11-26 DIAGNOSIS — E785 Hyperlipidemia, unspecified: Secondary | ICD-10-CM

## 2012-11-26 DIAGNOSIS — Z5189 Encounter for other specified aftercare: Secondary | ICD-10-CM

## 2012-11-26 DIAGNOSIS — R002 Palpitations: Secondary | ICD-10-CM

## 2012-11-26 DIAGNOSIS — Z0000 Encounter for general adult medical examination without abnormal findings: Secondary | ICD-10-CM

## 2012-11-26 DIAGNOSIS — F411 Generalized anxiety disorder: Secondary | ICD-10-CM

## 2012-11-26 NOTE — Telephone Encounter (Signed)
:   Labs at or prior to next visit, tsh, free T4, cbc   Patient has appointment in march/2015 and will be going to [facility] lab

## 2012-11-26 NOTE — Patient Instructions (Addendum)
MegaRed krill oil caps daily Probiotic supplements such as Digestive Advantage gummies or caps daily Call if need a muscle relaxer, remember moist heat and gentle stretching     Cholesterol Cholesterol is a white, waxy, fat-like protein needed by your body in small amounts. The liver makes all the cholesterol you need. It is carried from the liver by the blood through the blood vessels. Deposits (plaque) may build up on blood vessel walls. This makes the arteries narrower and stiffer. Plaque increases the risk for heart attack and stroke. You cannot feel your cholesterol level even if it is very high. The only way to know is by a blood test to check your lipid (fats) levels. Once you know your cholesterol levels, you should keep a record of the test results. Work with your caregiver to to keep your levels in the desired range. WHAT THE RESULTS MEAN:  Total cholesterol is a rough measure of all the cholesterol in your blood.  LDL is the so-called bad cholesterol. This is the type that deposits cholesterol in the walls of the arteries. You want this level to be low.  HDL is the good cholesterol because it cleans the arteries and carries the LDL away. You want this level to be high.  Triglycerides are fat that the body can either burn for energy or store. High levels are closely linked to heart disease. DESIRED LEVELS:  Total cholesterol below 200.  LDL below 100 for people at risk, below 70 for very high risk.  HDL above 50 is good, above 60 is best.  Triglycerides below 150. HOW TO LOWER YOUR CHOLESTEROL:  Diet.  Choose fish or white meat chicken and [place], roasted or baked. Limit fatty cuts of red meat, fried foods, and processed meats, such as sausage and lunch meat.  Eat lots of fresh fruits and vegetables. Choose whole grains, beans, pasta, potatoes and cereals.  Use only small amounts of olive, corn or canola oils. Avoid butter, mayonnaise, shortening or palm kernel oils.  Avoid foods with trans-fats.  Use skim/nonfat milk and low-fat/nonfat yogurt and cheeses. Avoid whole milk, cream, ice cream, egg yolks and cheeses. Healthy desserts include angel food cake, ginger snaps, animal crackers, hard candy, popsicles, and low-fat/nonfat frozen yogurt. Avoid pastries, cakes, pies and cookies.  Exercise.  A regular program helps decrease LDL and raises HDL.  Helps with weight control.  Do things that increase your activity level like gardening, walking, or taking the stairs.  Medication.  May be prescribed by your caregiver to help lowering cholesterol and the risk for heart disease.  You may need medicine even if your levels are normal if you have several risk factors. HOME CARE INSTRUCTIONS   Follow your diet and exercise programs as suggested by your caregiver.  Take medications as directed.  Have blood work done when your caregiver feels it is necessary. MAKE SURE YOU:   Understand these instructions.  Will watch your condition.  Will get help right away if you are not doing well or get worse. Document Released: 11/26/2000 Document Revised: 05/26/2011 Document Reviewed: 05/19/2007 [facility] Patient Information 2014 [place], [place].

## 2012-11-28 ENCOUNTER — Encounter: Payer: Self-pay | Admitting: Family Medicine

## 2012-11-28 DIAGNOSIS — S161XXA Strain of muscle, fascia and tendon at neck level, initial encounter: Secondary | ICD-10-CM

## 2012-11-28 DIAGNOSIS — M545 Low back pain, unspecified: Secondary | ICD-10-CM

## 2012-11-28 HISTORY — DX: Strain of muscle, fascia and tendon at neck level, initial encounter: S16.1XXA

## 2012-11-28 HISTORY — DX: Low back pain, unspecified: M54.50

## 2012-11-28 NOTE — Assessment & Plan Note (Signed)
Occurs infrequently, likely related ti stress,

## 2012-11-28 NOTE — Assessment & Plan Note (Signed)
Encouraged moist heat and gentle stretching, pain meds prn.

## 2012-11-28 NOTE — Assessment & Plan Note (Signed)
Mild avoid trans fats, increase exercise, add krill oil caps

## 2012-11-28 NOTE — Assessment & Plan Note (Signed)
Struggling with ongoing stressors, may use Alprazolam prn. Call if worsens and wants to discuss daily medication.

## 2012-11-28 NOTE — Progress Notes (Signed)
Patient ID: Maria Casey, female   DOB: October 12, 1975, 37 y.o.   MRN: [id number] Neeti Knudtson [id number] Dec 04, 1975 11/28/2012      Progress Note-Follow Up  Subjective  Chief Complaint  Chief Complaint  Patient presents with  . Follow-up    6 week    HPI  Patient has a transitional female who is in today for followup. Overall she's well. She continues to struggle with anxiety and has occasional palpitations without associated symptoms. No chest pain, palpitations, shortness of breath, GI GU concerns. Does have some trouble with neck pain intermittently more tension in her muscles anything else. No recent illness.  Past Medical History  Diagnosis Date  . Palpitations   . SOB (shortness of breath)   . Chest pain   . History of chicken pox   . Menorrhagia 10/24/2012  . Preventative health care 10/24/2012  . Other and unspecified hyperlipidemia 10/24/2012  . Anxiety state, unspecified 10/24/2012    Past Surgical History  Procedure Laterality Date  . Dilitation & currettage/hystroscopy with versapoint resection N/A 10/11/2012    Procedure: DILATATION & CURETTAGE/HYSTEROSCOPY WITH VERSAPOINT RESECTION;  Surgeon: Genia Del, MD;  Location: [facility];  Service: Gynecology;  Laterality: N/A;  endometrial polyp  . Tubal ligation    . Cesarean section      x 3    Family History  Problem Relation Age of Onset  . Heart attack Paternal Grandfather   . Hypertension Father   . Depression Sister   . Heart disease Son     coarctation of aorta repaired, Ross procedure, pulmonary graft, VSD repair    History   Social History  . Marital Status: Married    Spouse Name: N/A    Number of Children: N/A  . Years of Education: N/A   Occupational History  . Not on file.   Social History Main Topics  . Smoking status: Never Smoker   . Smokeless tobacco: Not on file  . Alcohol Use: No  . Drug Use: No  . Sexual Activity: Yes   Other Topics Concern  . Not on file    Social History Narrative  . No narrative on file    Current Outpatient Prescriptions on File Prior to Visit  Medication Sig Dispense Refill  . ALPRAZolam (XANAX) 0.25 MG tablet Take 1 tablet (0.25 mg total) by mouth 2 (two) times daily as needed for sleep.  20 tablet  0  . oxyCODONE-acetaminophen (PERCOCET) 7.5-325 MG per tablet Take 1 tablet by mouth every 4 (four) hours as needed for pain.  20 tablet  0   No current facility-administered medications on file prior to visit.    No Known Allergies  Review of Systems  Review of Systems  Constitutional: Negative for fever and malaise/fatigue.  HENT: Negative for congestion.   Eyes: Negative for discharge.  Respiratory: Negative for shortness of breath.   Cardiovascular: Negative for chest pain, palpitations and leg swelling.  Gastrointestinal: Negative for nausea, abdominal pain and diarrhea.  Genitourinary: Negative for dysuria.  Musculoskeletal: Negative for falls.  Skin: Negative for rash.  Neurological: Negative for loss of consciousness and headaches.  Endo/Heme/Allergies: Negative for polydipsia.  Psychiatric/Behavioral: Negative for depression and suicidal ideas. The patient is not nervous/anxious and does not have insomnia.     Objective  BP 102/72  Pulse 76  Temp(Src) 98 F (36.7 C) (Oral)  Ht 5\' 4"  (1.626 m)  Wt 172 lb 0.6 oz (78.037 kg)  BMI 29.52 kg/m2  SpO2 98%  LMP 11/24/2012  Physical Exam  Physical Exam  Constitutional: She is oriented to person, place, and time and well-developed, well-nourished, and in no distress. No distress.  HENT:  Head: Normocephalic and atraumatic.  Eyes: Conjunctivae are normal.  Neck: Neck supple. No thyromegaly present.  Cardiovascular: Normal rate, regular rhythm and normal heart sounds.   No murmur heard. Pulmonary/Chest: Effort normal and breath sounds normal. She has no wheezes.  Abdominal: She exhibits no distension and no mass.  Musculoskeletal: She exhibits no  edema.  Lymphadenopathy:    She has no cervical adenopathy.  Neurological: She is alert and oriented to person, place, and time.  Skin: Skin is warm and dry. No rash noted. She is not diaphoretic.  Psychiatric: Memory, affect and judgment normal.    Lab Results  Component Value Date   TSH 1.892 10/21/2012   Lab Results  Component Value Date   WBC 7.3 10/21/2012   HGB 13.2 10/21/2012   HCT 38.8 10/21/2012   MCV 83.1 10/21/2012   PLT 260 10/21/2012   Lab Results  Component Value Date   CREATININE 0.74 10/21/2012   BUN 9 10/21/2012   NA 138 10/21/2012   K 4.7 10/21/2012   CL 102 10/21/2012   CO2 30 10/21/2012   Lab Results  Component Value Date   ALT 12 10/21/2012   AST 16 10/21/2012   ALKPHOS 75 10/21/2012   BILITOT 0.4 10/21/2012   Lab Results  Component Value Date   CHOL 182 10/21/2012   Lab Results  Component Value Date   HDL 41 10/21/2012   Lab Results  Component Value Date   LDLCALC 117* 10/21/2012   Lab Results  Component Value Date   TRIG 119 10/21/2012   Lab Results  Component Value Date   CHOLHDL 4.4 10/21/2012     Assessment & Plan  Other and unspecified hyperlipidemia Mild avoid trans fats, increase exercise, add krill oil caps  Heart palpitations Occurs infrequently, likely related ti stress,  Anxiety state, unspecified Struggling with ongoing stressors, may use Alprazolam prn. Call if worsens and wants to discuss daily medication.   Neck strain Encouraged moist heat and gentle stretching, pain meds prn.

## 2012-11-29 ENCOUNTER — Ambulatory Visit: Payer: 59 | Admitting: Family Medicine

## 2012-11-30 ENCOUNTER — Telehealth: Payer: Self-pay | Admitting: Family Medicine

## 2012-11-30 NOTE — Telephone Encounter (Signed)
Pt called and stated she would like to proceed with stress test.

## 2012-11-30 NOTE — Telephone Encounter (Signed)
Patient states that she would like to proceed with stress test

## 2012-11-30 NOTE — Telephone Encounter (Signed)
I will refer her to cardiology for consideration of what form of stress test would be most appropriate for her. Just warn her and I will order

## 2012-12-01 NOTE — Telephone Encounter (Signed)
Patient informed. 

## 2013-01-20 ENCOUNTER — Other Ambulatory Visit: Payer: Self-pay

## 2013-03-08 ENCOUNTER — Encounter: Payer: Self-pay | Admitting: Family Medicine

## 2013-03-08 ENCOUNTER — Ambulatory Visit (INDEPENDENT_AMBULATORY_CARE_PROVIDER_SITE_OTHER): Payer: Commercial Managed Care - PPO | Admitting: Family Medicine

## 2013-03-08 VITALS — BP 120/70 | HR 89 | Temp 98.6°F | Ht 64.0 in | Wt 170.1 lb

## 2013-03-08 DIAGNOSIS — J111 Influenza due to unidentified influenza virus with other respiratory manifestations: Secondary | ICD-10-CM

## 2013-03-08 DIAGNOSIS — R05 Cough: Secondary | ICD-10-CM

## 2013-03-08 DIAGNOSIS — R509 Fever, unspecified: Secondary | ICD-10-CM

## 2013-03-08 DIAGNOSIS — J101 Influenza due to other identified influenza virus with other respiratory manifestations: Secondary | ICD-10-CM

## 2013-03-08 LAB — POCT INFLUENZA A/B: Influenza A, POC: POSITIVE

## 2013-03-08 MED ORDER — OSELTAMIVIR PHOSPHATE 75 MG PO CAPS: 75.0000 mg | ORAL_CAPSULE | Freq: Every day | ORAL | Status: DC

## 2013-03-08 MED ORDER — HYDROCODONE-HOMATROPINE 5-1.5 MG/5ML PO SYRP: 5.0000 mL | ORAL_SOLUTION | Freq: Three times a day (TID) | ORAL | Status: DC | PRN

## 2013-03-08 NOTE — Patient Instructions (Signed)
Increase rest and hydration Probiotics such as Digestive Advantage  Mucinex 600 mg twice daily x 10 days   Influenza, Adult Influenza ("the flu") is a viral infection of the respiratory tract. It occurs more often in winter months because people spend more time in close contact with one another. Influenza can make you feel very sick. Influenza easily spreads from person to person (contagious). CAUSES  Influenza is caused by a virus that infects the respiratory tract. You can catch the virus by breathing in droplets from an infected person's cough or sneeze. You can also catch the virus by touching something that was recently contaminated with the virus and then touching your mouth, nose, or eyes. SYMPTOMS  Symptoms typically last 4 to 10 days and may include:  Fever.  Chills.  Headache, body aches, and muscle aches.  Sore throat.  Chest discomfort and cough.  Poor appetite.  Weakness or feeling tired.  Dizziness.  Nausea or vomiting. DIAGNOSIS  Diagnosis of influenza is often made based on your history and a physical exam. A nose or throat swab test can be done to confirm the diagnosis. RISKS AND COMPLICATIONS You may be at risk for a more severe case of influenza if you smoke cigarettes, have diabetes, have chronic heart disease (such as heart failure) or lung disease (such as asthma), or if you have a weakened immune system. Elderly people and pregnant women are also at risk for more serious infections. The most common complication of influenza is a lung infection (pneumonia). Sometimes, this complication can require emergency medical care and may be life-threatening. PREVENTION  An annual influenza vaccination (flu shot) is the best way to avoid getting influenza. An annual flu shot is now routinely recommended for all adults in the [place] TREATMENT  In mild cases, influenza goes away on its own. Treatment is directed at relieving symptoms. For more severe cases, your caregiver  may prescribe antiviral medicines to shorten the sickness. Antibiotic medicines are not effective, because the infection is caused by a virus, not by bacteria. HOME CARE INSTRUCTIONS  Only take over-the-counter or prescription medicines for pain, discomfort, or fever as directed by your caregiver.  Use a cool mist humidifier to make breathing easier.  Get plenty of rest until your temperature returns to normal. This usually takes 3 to 4 days.  Drink enough fluids to keep your urine clear or pale yellow.  Cover your mouth and nose when coughing or sneezing, and wash your hands well to avoid spreading the virus.  Stay home from work or school until your fever has been gone for at least 1 full day. SEEK MEDICAL CARE IF:   You have chest pain or a deep cough that worsens or produces more mucus.  You have nausea, vomiting, or diarrhea. SEEK IMMEDIATE MEDICAL CARE IF:   You have difficulty breathing, shortness of breath, or your skin or nails turn bluish.  You have severe neck pain or stiffness.  You have a severe headache, facial pain, or earache.  You have a worsening or recurring fever.  You have nausea or vomiting that cannot be controlled. MAKE SURE YOU:  Understand these instructions.  Will watch your condition.  Will get help right away if you are not doing well or get worse. Document Released: 02/29/2000 Document Revised: 09/02/2011 Document Reviewed: 06/02/2011 [facility] Patient Information 2014 [place], [place].

## 2013-03-08 NOTE — Progress Notes (Signed)
Pre visit review using our clinic review tool, if applicable. No additional management support is needed unless otherwise documented below in the visit note. 

## 2013-03-13 ENCOUNTER — Encounter: Payer: Self-pay | Admitting: Family Medicine

## 2013-03-13 DIAGNOSIS — J101 Influenza due to other identified influenza virus with other respiratory manifestations: Secondary | ICD-10-CM | POA: Insufficient documentation

## 2013-03-13 NOTE — Assessment & Plan Note (Signed)
Type A POCT confirmed. Given Hycodan for cough and Tamiflu, increase rest and hdyration

## 2013-05-04 ENCOUNTER — Ambulatory Visit: Payer: Commercial Managed Care - PPO | Admitting: Physician Assistant

## 2013-05-11 ENCOUNTER — Telehealth: Payer: Self-pay | Admitting: Family Medicine

## 2013-05-11 DIAGNOSIS — R350 Frequency of micturition: Secondary | ICD-10-CM

## 2013-05-11 NOTE — Telephone Encounter (Signed)
I am more than happy to order an A1C but I cannot guarantee insurance will pay for it for urinary frequency. If she wants it order that reason, I cannot find anyother payable diagnosis in the chart

## 2013-05-11 NOTE — Addendum Note (Signed)
Addended by: Court JoyFREEMAN, Zareah Hunzeker L on: 05/11/2013 05:25 PM   Modules accepted: Orders

## 2013-05-11 NOTE — Telephone Encounter (Signed)
Pt informed and states she would still like it ordered and is going to the [place] office next week

## 2013-05-11 NOTE — Telephone Encounter (Signed)
Please advise 

## 2013-05-11 NOTE — Telephone Encounter (Signed)
Wants hemoglobin a1c her gyno suggested she have this done as she is urinating much more frequently.  She has labs scheduled for Wednesday

## 2013-05-18 ENCOUNTER — Telehealth: Payer: Self-pay | Admitting: Family Medicine

## 2013-05-18 ENCOUNTER — Other Ambulatory Visit: Payer: Self-pay | Admitting: Family Medicine

## 2013-05-18 DIAGNOSIS — E785 Hyperlipidemia, unspecified: Secondary | ICD-10-CM

## 2013-05-18 MED ORDER — ESCITALOPRAM OXALATE 10 MG PO TABS: 10.0000 mg | ORAL_TABLET | Freq: Every day | ORAL | Status: DC

## 2013-05-18 NOTE — Telephone Encounter (Signed)
Patient states that at last visit her and Dr. Abner GreenspanBlyth talked about starting anxiety medication. Patient states that at first she did not want to take anything but now she says that she does want to start taking an anxiety med. She states that she feels her anxiety has worsened.

## 2013-05-18 NOTE — Telephone Encounter (Signed)
ordered

## 2013-05-18 NOTE — Telephone Encounter (Signed)
Please call her in Escitalopram 10 mg tabs have her start 1/2 tab po daily x 7 days then increase to a full tab as tolerated. Disp #30 with 1 rf. SE include nausea, diarrhea, herpersomnolence or insomnia. All should improve in 2-3 weeks if tolerable.will take a while to work so take daily and come in in roughly 6 weeks or sooner at needed

## 2013-05-18 NOTE — Telephone Encounter (Signed)
Left message for pt to return my call.

## 2013-05-18 NOTE — Telephone Encounter (Signed)
Patient informed, understood & agreed; new Rx to pharmacy. Pt has f/u appt already scheduled w/PCP on 03.13.15 at 9:00am/SL[facility]

## 2013-05-18 NOTE — Telephone Encounter (Signed)
Pt is requesting cholesterol check be added to her lab order, [place] [address]

## 2013-05-19 ENCOUNTER — Other Ambulatory Visit (INDEPENDENT_AMBULATORY_CARE_PROVIDER_SITE_OTHER): Payer: Commercial Managed Care - PPO

## 2013-05-19 DIAGNOSIS — R350 Frequency of micturition: Secondary | ICD-10-CM

## 2013-05-19 DIAGNOSIS — Z0000 Encounter for general adult medical examination without abnormal findings: Secondary | ICD-10-CM

## 2013-05-19 DIAGNOSIS — E785 Hyperlipidemia, unspecified: Secondary | ICD-10-CM

## 2013-05-19 LAB — LIPID PANEL
Cholesterol: 165 mg/dL (ref 0–200)
HDL: 45.7 mg/dL (ref >39.00)
LDL Cholesterol: 104 mg/dL — ABNORMAL HIGH (ref 0–99)
TRIGLYCERIDES: 77 mg/dL (ref 0.0–149.0)
Total CHOL/HDL Ratio: 4
VLDL: 15.4 mg/dL (ref 0.0–40.0)

## 2013-05-19 LAB — CBC
HEMATOCRIT: 39.8 % (ref 36.0–46.0)
Hemoglobin: 13.1 g/dL (ref 12.0–15.0)
MCHC: 33.1 g/dL (ref 30.0–36.0)
MCV: 86.4 fl (ref 78.0–100.0)
Platelets: 217 10*3/uL (ref 150.0–400.0)
RBC: 4.6 Mil/uL (ref 3.87–5.11)
RDW: 15 % — AB (ref 11.5–14.6)
WBC: 6.5 10*3/uL (ref 4.5–10.5)

## 2013-05-19 LAB — T4, FREE: FREE T4: 0.89 ng/dL (ref 0.60–1.60)

## 2013-05-19 LAB — HEMOGLOBIN A1C: HEMOGLOBIN A1C: 5.7 % (ref 4.6–6.5)

## 2013-05-19 LAB — TSH: TSH: 2.83 u[IU]/mL (ref 0.35–5.50)

## 2013-05-24 ENCOUNTER — Telehealth: Payer: Self-pay | Admitting: Internal Medicine

## 2013-05-24 NOTE — Telephone Encounter (Signed)
Patient has chronic constipation and states she has a hx of polyps. She has started having abdominal pain that radiates to her back. Saw her GYN and had negative US. She has also noticed some changes in her stool. Scheduled with Mike GipAmy Esterwood, PA on 05/26/13 at 2:00 PM.

## 2013-05-26 ENCOUNTER — Ambulatory Visit: Payer: Commercial Managed Care - PPO | Admitting: Family Medicine

## 2013-05-26 ENCOUNTER — Ambulatory Visit (INDEPENDENT_AMBULATORY_CARE_PROVIDER_SITE_OTHER): Payer: Commercial Managed Care - PPO | Admitting: Physician Assistant

## 2013-05-26 ENCOUNTER — Other Ambulatory Visit (INDEPENDENT_AMBULATORY_CARE_PROVIDER_SITE_OTHER): Payer: Commercial Managed Care - PPO

## 2013-05-26 ENCOUNTER — Encounter: Payer: Self-pay | Admitting: Physician Assistant

## 2013-05-26 VITALS — BP 102/70 | HR 80 | Ht 64.0 in | Wt 171.4 lb

## 2013-05-26 DIAGNOSIS — R1031 Right lower quadrant pain: Secondary | ICD-10-CM

## 2013-05-26 DIAGNOSIS — K6289 Other specified diseases of anus and rectum: Secondary | ICD-10-CM

## 2013-05-26 DIAGNOSIS — K5900 Constipation, unspecified: Secondary | ICD-10-CM

## 2013-05-26 LAB — CBC WITH DIFFERENTIAL/PLATELET
BASOS ABS: 0 10*3/uL (ref 0.0–0.1)
Basophils Relative: 0.3 % (ref 0.0–3.0)
EOS ABS: 0 10*3/uL (ref 0.0–0.7)
EOS PCT: 0.3 % (ref 0.0–5.0)
HEMATOCRIT: 39.2 % (ref 36.0–46.0)
Hemoglobin: 13.1 g/dL (ref 12.0–15.0)
LYMPHS ABS: 2.6 10*3/uL (ref 0.7–4.0)
Lymphocytes Relative: 27.7 % (ref 12.0–46.0)
MCHC: 33.4 g/dL (ref 30.0–36.0)
MCV: 86.4 fl (ref 78.0–100.0)
MONO ABS: 0.6 10*3/uL (ref 0.1–1.0)
Monocytes Relative: 6 % (ref 3.0–12.0)
NEUTROS PCT: 65.7 % (ref 43.0–77.0)
Neutro Abs: 6.1 10*3/uL (ref 1.4–7.7)
PLATELETS: 231 10*3/uL (ref 150.0–400.0)
RBC: 4.54 Mil/uL (ref 3.87–5.11)
RDW: 15.1 % — AB (ref 11.5–14.6)
WBC: 9.3 10*3/uL (ref 4.5–10.5)

## 2013-05-26 LAB — HIGH SENSITIVITY CRP: CRP HIGH SENSITIVITY: 4.05 mg/L (ref 0.000–5.000)

## 2013-05-26 MED ORDER — HYDROCORTISONE ACETATE 25 MG RE SUPP: RECTAL | Status: DC

## 2013-05-26 NOTE — Progress Notes (Signed)
Subjective:    Patient ID: Maria GibneyMelissa Casey, female    DOB: 1976/01/28, 38 y.o.   MRN: [id number]  HPI  Maria KaufmannMelissa  is a pleasant 38 year old female known remotely to Dr. Juanda ChanceBrodie. She had been seen in 2006 and was diagnosed with external hemorrhoids. She has not had any previous procedures. Patient comes in today stating that she has been having lower abnormal pain over the past 3 weeks. She has been to her gynecologist who did a pelvic exam and pelvic ultrasound and did not find any problems. Patient was concerned because she had had a uterine polyp removed last summer. She says her pain is persisting. She has had an increasing constipation and has also been noticing mucus in her stools but no blood. No fever or chills. Appetite has been okay. She describes the pain as sharp and located bilaterally in the lower quadrants sometimes with radiation around into her back. She has not noticed any change with bowel movements urination etc. She's not had any prior abdominal   Surgery. She also mentions that she has been having some rectal discomfort off and on for a couple of months. She says sometimes it's uncomfortable to sit.  Review of Systems  Constitutional: Negative.   HENT: Negative.   Eyes: Negative.   Respiratory: Negative.   Cardiovascular: Negative.   Gastrointestinal: Positive for abdominal pain, constipation and rectal pain.  Endocrine: Negative.   Genitourinary: Negative.   Musculoskeletal: Positive for back pain.  Skin: Negative.   Allergic/Immunologic: Negative.   Neurological: Negative.   Hematological: Negative.   Psychiatric/Behavioral: Negative.    Outpatient Prescriptions Prior to Visit  Medication Sig Dispense Refill  . ALPRAZolam (XANAX) 0.25 MG tablet Take 1 tablet (0.25 mg total) by mouth 2 (two) times daily as needed for sleep.  20 tablet  0  . escitalopram (LEXAPRO) 10 MG tablet Take 1 tablet (10 mg total) by mouth daily. Take 0.5 tablet [5 mg total] PO daily for  first 7 days  30 tablet  0  . HYDROcodone-homatropine (HYCODAN) 5-1.5 MG/5ML syrup Take 5 mLs by mouth every 8 (eight) hours as needed for cough.  180 mL  0  . oseltamivir (TAMIFLU) 75 MG capsule Take 1 capsule (75 mg total) by mouth daily.  5 capsule  0   No facility-administered medications prior to visit.   No Known Allergies Patient Active Problem List   Diagnosis Date Noted  . Influenza A 03/13/2013  . Neck strain 11/28/2012  . Menorrhagia 10/24/2012  . Preventative health care 10/24/2012  . Other and unspecified hyperlipidemia 10/24/2012  . Anxiety state, unspecified 10/24/2012  . Heart palpitations 01/28/2012   History  Substance Use Topics  . Smoking status: Never Smoker   . Smokeless tobacco: Never Used  . Alcohol Use: No   family history includes Depression in her sister; Heart attack in her paternal grandfather; Heart disease in her son; Hypertension in her father.     Objective:   Physical Exam well-developed young female in no acute distress, pleasant blood pressure 102/70 pulse 80 height 5 foot 4 weight 171. HEENT ;nontraumatic normocephalic EOMI PERRLA sclera anicteric, Supple ;no JVD, Cardiovascular regular rate and rhythm with S1-S2 no murmur or gallop, Pulmonary; clear bilaterally, Abdomen ;soft bowel sounds are present she is quite tender in the right lower ordered there is no guarding or rebound no palpable mass or hepatosplenomegaly, Rectal; exam small external hemorrhoids noninflamed on digital exam she does have some tenderness but no palpable fissure stool is heme-negative,  Extremities; no clubbing cyanosis or edema skin warm and dry, Psych; mood and affect appropriate        Assessment & Plan:  #45 38 year old female with 3 week history of persistent lower Donald pain primarily right-sided by exam ,change in bowel habits with constipation and mucoid stools Rule out right-sided intra-abdominal inflammatory process/IBD #2 external and probable internal  hemorrhoids  Plan; CBC with differential and CRP Schedule for CT scan of the abdomen and pelvis with contrast Anusol-HC suppositories at bedtime x7 days then as needed Consider colonoscopy with Dr. Juanda Chance  if CT scan is unrevealing

## 2013-05-26 NOTE — Progress Notes (Signed)
Reviewed and agree.

## 2013-05-26 NOTE — Patient Instructions (Signed)
Please go to the basement level to have your labs drawn. We sent a prescription to [facility] for [employer] [facility] suppositories.   You have been scheduled for a CT scan of the abdomen and pelvis at [facility] ([address]---this is in the same building as [facility]).   You are scheduled on 06-01-2013 at 10:00 AM. You should arrive at 9:45 am prior to your appointment time for registration. Please follow the written instructions below on the day of your exam:  WARNING: IF YOU ARE ALLERGIC TO IODINE/X-RAY DYE, PLEASE NOTIFY RADIOLOGY IMMEDIATELY AT [phone]! YOU WILL BE GIVEN A 13 HOUR PREMEDICATION PREP.  1) Do not eat or drink anything after 6:00 am  (4 hours prior to your test) 2) You have been given 2 bottles of oral contrast to drink. The solution may taste better if refrigerated, but do NOT add ice or any other liquid to this solution. Shake well before drinking.    Drink 1 bottle of contrast @ 8:00 am  (2 hours prior to your exam)  Drink 1 bottle of contrast @ 9:00 am  (1 hour prior to your exam)  You may take any medications as prescribed with a small amount of water except for the following: Metformin, Glucophage, Glucovance, Avandamet, Riomet, Fortamet, Actoplus Met, Janumet, Glumetza or Metaglip. The above medications must be held the day of the exam AND 48 hours after the exam.  The purpose of you drinking the oral contrast is to aid in the visualization of your intestinal tract. The contrast solution may cause some diarrhea. Before your exam is started, you will be given a small amount of fluid to drink. Depending on your individual set of symptoms, you may also receive an intravenous injection of x-ray contrast/dye. Plan on being at [facility] for 30 minutes or long, depending on the type of exam you are having performed.  If you have any questions regarding your exam or if you need to reschedule, you may call the CT department at [phone]  between the hours of 8:00 am and 5:00 pm, Monday-Friday.  ________________________________________________________________________

## 2013-05-27 ENCOUNTER — Ambulatory Visit (INDEPENDENT_AMBULATORY_CARE_PROVIDER_SITE_OTHER): Payer: Commercial Managed Care - PPO | Admitting: Family Medicine

## 2013-05-27 ENCOUNTER — Encounter: Payer: Self-pay | Admitting: Family Medicine

## 2013-05-27 VITALS — BP 108/64 | HR 79 | Temp 98.1°F | Ht 64.0 in | Wt 171.0 lb

## 2013-05-27 DIAGNOSIS — M545 Low back pain, unspecified: Secondary | ICD-10-CM

## 2013-05-27 DIAGNOSIS — E785 Hyperlipidemia, unspecified: Secondary | ICD-10-CM

## 2013-05-27 DIAGNOSIS — Z833 Family history of diabetes mellitus: Secondary | ICD-10-CM

## 2013-05-27 DIAGNOSIS — K5900 Constipation, unspecified: Secondary | ICD-10-CM

## 2013-05-27 DIAGNOSIS — R002 Palpitations: Secondary | ICD-10-CM

## 2013-05-27 NOTE — Patient Instructions (Signed)
°  Muscle Cramps and Spasms °Muscle cramps and spasms occur when a muscle or muscles tighten and you have no control over this tightening (involuntary muscle contraction). They are a common problem and can develop in any muscle. The most common place is in the calf muscles of the leg. Both muscle cramps and muscle spasms are involuntary muscle contractions, but they also have differences:  °· Muscle cramps are sporadic and painful. They may last a few seconds to a quarter of an hour. Muscle cramps are often more forceful and last longer than muscle spasms. °· Muscle spasms may or may not be painful. They may also last just a few seconds or much longer. °CAUSES  °It is uncommon for cramps or spasms to be due to a serious underlying problem. In many cases, the cause of cramps or spasms is unknown. Some common causes are:  °· Overexertion.   °· Overuse from repetitive motions (doing the same thing over and over).   °· Remaining in a certain position for a long period of time.   °· Improper preparation, form, or technique while performing a sport or activity.   °· Dehydration.   °· Injury.   °· Side effects of some medicines.   °· Abnormally low levels of the salts and ions in your blood (electrolytes), especially potassium and calcium. This could happen if you are taking water pills (diuretics) or you are pregnant.   °Some underlying medical problems can make it more likely to develop cramps or spasms. These include, but are not limited to:  °· Diabetes.   °· Parkinson disease.   °· Hormone disorders, such as thyroid problems.   °· Alcohol abuse.   °· Diseases specific to muscles, joints, and bones.   °· Blood vessel disease where not enough blood is getting to the muscles.   °HOME CARE INSTRUCTIONS  °· Stay well hydrated. Drink enough water and fluids to keep your urine clear or pale yellow. °· It may be helpful to massage, stretch, and relax the affected muscle. °· For tight or tense muscles, use a warm towel, heating  pad, or hot shower water directed to the affected area. °· If you are sore or have pain after a cramp or spasm, applying ice to the affected area may relieve discomfort. °· Put ice in a plastic bag. °· Place a towel between your skin and the bag. °· Leave the ice on for 15-20 minutes, 03-04 times a day. °· Medicines used to treat a known cause of cramps or spasms may help reduce their frequency or severity. Only take over-the-counter or prescription medicines as directed by your caregiver. °SEEK MEDICAL CARE IF:  °Your cramps or spasms get more severe, more frequent, or do not improve over time.  °MAKE SURE YOU:  °· Understand these instructions. °· Will watch your condition. °· Will get help right away if you are not doing well or get worse. °Document Released: 08/23/2001 Document Revised: 06/28/2012 Document Reviewed: 02/18/2012 °ExitCare® Patient Information ©2014 [employer]. ° ° °

## 2013-05-27 NOTE — Progress Notes (Signed)
Pre visit review using our clinic review tool, if applicable. No additional management support is needed unless otherwise documented below in the visit note. 

## 2013-06-01 ENCOUNTER — Other Ambulatory Visit: Payer: Commercial Managed Care - PPO

## 2013-06-01 ENCOUNTER — Encounter: Payer: Self-pay | Admitting: Family Medicine

## 2013-06-01 DIAGNOSIS — Z833 Family history of diabetes mellitus: Secondary | ICD-10-CM

## 2013-06-01 DIAGNOSIS — K5900 Constipation, unspecified: Secondary | ICD-10-CM

## 2013-06-01 HISTORY — DX: Family history of diabetes mellitus: Z83.3

## 2013-06-01 HISTORY — DX: Constipation, unspecified: K59.00

## 2013-06-01 NOTE — Assessment & Plan Note (Signed)
Describes a pulling sensation from above upper left posterior hip sometimes extending around to LLQ responds to position changes. Has seen OB/GYN and UP unremarkable. C/w musculoskeletal pain. Encouraged moist heat and gentle stretching as tolerated. May try NSAIDs and prescription meds as directed and report if symptoms worsen or seek immediate care

## 2013-06-01 NOTE — Assessment & Plan Note (Addendum)
No recent episodes. Minimize simple carbs.

## 2013-06-01 NOTE — Progress Notes (Signed)
Patient ID: Maria Casey, female   DOB: Aug 05, 1975, 38 y.o.   MRN: [id number] Najat Olazabal [id number] 1976-02-16 06/01/2013      Progress Note-Follow Up  Subjective  Chief Complaint  Chief Complaint  Patient presents with  . Follow-up    6 month    HPI  Patient is a 38 year old female in today for routine medical care. Patient is noting some intermittent low back pain as well as some lower quadrant abdominal pain. She is seeing her OB/GYN and a prescription has been unremarkable. She acknowledges some increasing constipation recently. She has added some Ativan and that has helped some. She is moving her bowels every other to every day. She often has hard stool and she has to strain. No bloody or tarry stool. No change in urinary symptoms or dysuria. She describes a pulling sensation over her back most notably on the right. It radiates really from her lower ribs across her hip and sometimes to the front. She denies any injuries or traumas. There is no incontinence. She denies shortness of breath, palpitations or chest pain. No fevers or chills. No change in appetite. Anxiety is tolerable at this time. She has noted some recent blurry vision.   Past Medical History  Diagnosis Date  . Palpitations   . SOB (shortness of breath)   . Chest pain   . History of chicken pox   . Menorrhagia 10/24/2012  . Preventative health care 10/24/2012  . Other and unspecified hyperlipidemia 10/24/2012  . Anxiety state, unspecified 10/24/2012  . Neck strain 11/28/2012  . Unspecified constipation 06/01/2013  . FH: diabetes mellitus 06/01/2013  . Low back pain 11/28/2012    Past Surgical History  Procedure Laterality Date  . Dilitation & currettage/hystroscopy with versapoint resection N/A 10/11/2012    Procedure: DILATATION & CURETTAGE/HYSTEROSCOPY WITH VERSAPOINT RESECTION;  Surgeon: Genia Del, MD;  Location: [facility];  Service: Gynecology;  Laterality: N/A;  endometrial polyp  .  Tubal ligation    . Cesarean section      x 3    Family History  Problem Relation Age of Onset  . Heart attack Paternal Grandfather   . Hypertension Father   . Depression Sister   . Heart disease Son     coarctation of aorta repaired, Ross procedure, pulmonary graft, VSD repair    History   Social History  . Marital Status: Married    Spouse Name: N/A    Number of Children: 3  . Years of Education: N/A   Occupational History  . NURSING SECRETARY     Social History Main Topics  . Smoking status: Never Smoker   . Smokeless tobacco: Never Used  . Alcohol Use: No  . Drug Use: No  . Sexual Activity: Yes   Other Topics Concern  . Not on file   Social History Narrative  . No narrative on file    Current Outpatient Prescriptions on File Prior to Visit  Medication Sig Dispense Refill  . ALPRAZolam (XANAX) 0.25 MG tablet Take 1 tablet (0.25 mg total) by mouth 2 (two) times daily as needed for sleep.  20 tablet  0  . escitalopram (LEXAPRO) 10 MG tablet Take 1 tablet (10 mg total) by mouth daily. Take 0.5 tablet [5 mg total] PO daily for first 7 days  30 tablet  0   No current facility-administered medications on file prior to visit.    No Known Allergies  Review of Systems  Review of Systems  Constitutional:  Negative for fever and malaise/fatigue.  HENT: Negative for congestion.   Eyes: Positive for blurred vision. Negative for double vision, photophobia, pain, discharge and redness.  Respiratory: Negative for shortness of breath.   Cardiovascular: Negative for chest pain, palpitations and leg swelling.  Gastrointestinal: Positive for abdominal pain. Negative for nausea and diarrhea.  Genitourinary: Negative for dysuria.  Musculoskeletal: Positive for back pain. Negative for falls.  Skin: Negative for rash.  Neurological: Negative for loss of consciousness and headaches.  Endo/Heme/Allergies: Negative for polydipsia.  Psychiatric/Behavioral: Negative for  depression and suicidal ideas. The patient is nervous/anxious. The patient does not have insomnia.     Objective  BP 108/64  Pulse 79  Temp(Src) 98.1 F (36.7 C) (Oral)  Ht 5\' 4"  (1.626 m)  Wt 171 lb (77.565 kg)  BMI 29.34 kg/m2  SpO2 98%  LMP 04/21/2013  Physical Exam  Physical Exam  Constitutional: She is oriented to person, place, and time and well-developed, well-nourished, and in no distress. No distress.  HENT:  Head: Normocephalic and atraumatic.  Eyes: Conjunctivae are normal.  Neck: Neck supple. No thyromegaly present.  Cardiovascular: Normal rate, regular rhythm and normal heart sounds.   No murmur heard. Pulmonary/Chest: Effort normal and breath sounds normal. She has no wheezes.  Abdominal: Soft. Bowel sounds are normal. She exhibits no distension and no mass. There is no tenderness. There is no rebound and no guarding.  Musculoskeletal: She exhibits tenderness. She exhibits no edema.  Muscle spasm over back above right hip  Lymphadenopathy:    She has no cervical adenopathy.  Neurological: She is alert and oriented to person, place, and time.  Skin: Skin is warm and dry. No rash noted. She is not diaphoretic.  Psychiatric: Memory, affect and judgment normal.    Lab Results  Component Value Date   TSH 2.83 05/19/2013   Lab Results  Component Value Date   WBC 9.3 05/26/2013   HGB 13.1 05/26/2013   HCT 39.2 05/26/2013   MCV 86.4 05/26/2013   PLT 231.0 05/26/2013   Lab Results  Component Value Date   CREATININE 0.74 10/21/2012   BUN 9 10/21/2012   NA 138 10/21/2012   K 4.7 10/21/2012   CL 102 10/21/2012   CO2 30 10/21/2012   Lab Results  Component Value Date   ALT 12 10/21/2012   AST 16 10/21/2012   ALKPHOS 75 10/21/2012   BILITOT 0.4 10/21/2012   Lab Results  Component Value Date   CHOL 165 05/19/2013   Lab Results  Component Value Date   HDL 45.70 05/19/2013   Lab Results  Component Value Date   LDLCALC 104* 05/19/2013   Lab Results  Component Value Date   TRIG  77.0 05/19/2013   Lab Results  Component Value Date   CHOLHDL 4 05/19/2013     Assessment & Plan  Other and unspecified hyperlipidemia Improved. Encouraged heart healthy diet, increase exercise, avoid trans fats, consider a krill oil cap daily  Heart palpitations No recent episodes. Minimize simple carbs.  Unspecified constipation Encouraged increased hydration and fiber in diet. Daily probiotics. If bowels not moving can use MOM 2 tbls po in 4 oz of warm prune juice by mouth every 2-3 days. If no results then repeat in 4 hours with  Dulcolax suppository pr, may repeat again in 4 more hours as needed. Seek care if symptoms worsen. Consider daily Miralax and/or Dulcolax if symptoms persist.   FH: diabetes mellitus Patient very worried about this due to some  recent blurry vision. Requested A1c and it was 5.7 encouraged to minimize simple carbs and increase exercise. Follow up with opthamology if vision worsens  Low back pain Describes a pulling sensation from above upper left posterior hip sometimes extending around to LLQ responds to position changes. Has seen OB/GYN and UP unremarkable. C/w musculoskeletal pain. Encouraged moist heat and gentle stretching as tolerated. May try NSAIDs and prescription meds as directed and report if symptoms worsen or seek immediate care

## 2013-06-01 NOTE — Assessment & Plan Note (Signed)
Encouraged increased hydration and fiber in diet. Daily probiotics. If bowels not moving can use MOM 2 tbls po in 4 oz of warm prune juice by mouth every 2-3 days. If no results then repeat in 4 hours with  Dulcolax suppository pr, may repeat again in 4 more hours as needed. Seek care if symptoms worsen. Consider daily Miralax and/or Dulcolax if symptoms persist.  

## 2013-06-01 NOTE — Assessment & Plan Note (Signed)
Patient very worried about this due to some recent blurry vision. Requested A1c and it was 5.7 encouraged to minimize simple carbs and increase exercise. Follow up with opthamology if vision worsens

## 2013-06-01 NOTE — Assessment & Plan Note (Signed)
Improved. Encouraged heart healthy diet, increase exercise, avoid trans fats, consider a krill oil cap daily

## 2013-06-03 ENCOUNTER — Telehealth: Payer: Self-pay

## 2013-06-03 NOTE — Telephone Encounter (Signed)
She can move til Thursday or Friday if she wants to

## 2013-06-03 NOTE — Telephone Encounter (Signed)
Patient left a message stating that she made an appt for Monday because she is not feeling any better but her CT scan isn't until Tuesday? Pt would like to know if she should come in on Monday or wait until after her CT scan?  Please advise?

## 2013-06-06 ENCOUNTER — Ambulatory Visit: Payer: Commercial Managed Care - PPO | Admitting: Family Medicine

## 2013-06-07 ENCOUNTER — Ambulatory Visit (INDEPENDENT_AMBULATORY_CARE_PROVIDER_SITE_OTHER)
Admission: RE | Admit: 2013-06-07 | Discharge: 2013-06-07 | Disposition: A | Discharge status: Home / Self Care | Payer: Commercial Managed Care - PPO | Source: Ambulatory Visit | Attending: Physician Assistant | Admitting: Physician Assistant

## 2013-06-07 DIAGNOSIS — K5900 Constipation, unspecified: Secondary | ICD-10-CM

## 2013-06-07 DIAGNOSIS — K6289 Other specified diseases of anus and rectum: Secondary | ICD-10-CM

## 2013-06-07 DIAGNOSIS — R1031 Right lower quadrant pain: Secondary | ICD-10-CM

## 2013-06-07 MED ORDER — IOHEXOL 300 MG/ML  SOLN
100.0000 mL | Freq: Once | INTRAMUSCULAR | Status: AC | PRN
  Administered 2013-06-07: 100 mL via INTRAVENOUS

## 2013-06-09 ENCOUNTER — Inpatient Hospital Stay: Admission: RE | Admit: 2013-06-09 | Payer: Commercial Managed Care - PPO | Source: Ambulatory Visit

## 2013-06-10 ENCOUNTER — Ambulatory Visit: Payer: Commercial Managed Care - PPO | Admitting: Family Medicine

## 2013-06-14 ENCOUNTER — Encounter: Payer: Self-pay | Admitting: Family Medicine

## 2013-06-14 ENCOUNTER — Ambulatory Visit (INDEPENDENT_AMBULATORY_CARE_PROVIDER_SITE_OTHER): Payer: Commercial Managed Care - PPO | Admitting: Family Medicine

## 2013-06-14 VITALS — BP 130/82 | HR 87 | Temp 98.6°F | Ht 64.0 in | Wt 167.0 lb

## 2013-06-14 DIAGNOSIS — I7300 Raynaud's syndrome without gangrene: Secondary | ICD-10-CM

## 2013-06-14 DIAGNOSIS — R002 Palpitations: Secondary | ICD-10-CM

## 2013-06-14 DIAGNOSIS — E785 Hyperlipidemia, unspecified: Secondary | ICD-10-CM

## 2013-06-14 NOTE — Patient Instructions (Signed)
Raynaud's Syndrome Raynaud's Syndrome is a disorder of the blood vessels in your hands and feet. It occurs when small arteries of the arms/hands or legs/feet become sensitive to cold or emotional upset. This causes the arteries to constrict, or narrow, and reduces blood flow to the area. The color in the fingers or toes changes from white to bluish to red and this is not usually painful. There may be numbness and tingling. Sores on the skin (ulcers) can form. Symptoms are usually relieved by warming. HOME CARE INSTRUCTIONS   Avoid exposure to cold. Keep your whole body warm and dry. Dress in layers. Wear mittens or gloves when handling ice or frozen food and when outdoors. Use holders for glasses or cans containing cold drinks. If possible, stay indoors during cold weather.  Limit your use of caffeine. Switch to decaffeinated coffee, tea, and soda pop. Avoid chocolate.  Avoid smoking or being around cigarette smoke. Smoke will make symptoms worse.  Wear loose fitting socks and comfortable, roomy shoes.  Avoid vibrating tools and machinery.  If possible, avoid stressful and emotional situations. Exercise, meditation and yoga may help you cope with stress. Biofeedback may be useful.  Ask your caregiver about medicine (calcium channel blockers) that may control Raynaud's phenomena. SEEK MEDICAL CARE IF:   Your discomfort becomes worse, despite conservative treatment.  You develop sores on your fingers and toes that do not heal. Document Released: 02/29/2000 Document Revised: 05/26/2011 Document Reviewed: 03/07/2008 Exit[employer] Patient Information 2014 [employer].  

## 2013-06-14 NOTE — Progress Notes (Signed)
Pre visit review using our clinic review tool, if applicable. No additional management support is needed unless otherwise documented below in the visit note. 

## 2013-06-15 ENCOUNTER — Encounter: Payer: Self-pay | Admitting: Family Medicine

## 2013-06-15 DIAGNOSIS — I7300 Raynaud's syndrome without gangrene: Secondary | ICD-10-CM

## 2013-06-15 HISTORY — DX: Raynaud's syndrome without gangrene: I73.00

## 2013-06-15 NOTE — Progress Notes (Signed)
Patient ID: Maria Casey, female   DOB: [birth year], 38 y.o.   MRN: [id number] Maria Casey [id number] 1975/09/22 06/15/2013      Progress Note-Follow Up  Subjective  Chief Complaint  Chief Complaint  Patient presents with  . feet turning purple    HPI  Patient is a 38 year old female in today for routine medical care. He is here today concerned about discoloration in her feet with prolonged sitting. She notes that when she sits for long time or her feet get cold she gets a grey purplish tinge to her toes. When she gets up and walks around the symptoms resolve. There is no associated pain in the toes. She has similar purple in her fingers at times. She does note some occasional calf pain but that is status post an injury over year ago. No swelling or warmth is associated. Left leg is not otherwise worse in the right leg. No other recent illness or acute complaints. Denies CP/palp/SOB/HA/congestion/fevers/GI or GU c/o. Taking meds as prescribed  Past Medical History  Diagnosis Date  . Palpitations   . SOB (shortness of breath)   . Chest pain   . History of chicken pox   . Menorrhagia 10/24/2012  . Preventative health care 10/24/2012  . Other and unspecified hyperlipidemia 10/24/2012  . Anxiety state, unspecified 10/24/2012  . Neck strain 11/28/2012  . Unspecified constipation 06/01/2013  . FH: diabetes mellitus 06/01/2013  . Low back pain 11/28/2012  . Raynaud phenomenon 06/15/2013    Past Surgical History  Procedure Laterality Date  . Dilitation & currettage/hystroscopy with versapoint resection N/A 10/11/2012    Procedure: DILATATION & CURETTAGE/HYSTEROSCOPY WITH VERSAPOINT RESECTION;  Surgeon: Genia Del, MD;  Location: [facility];  Service: Gynecology;  Laterality: N/A;  endometrial polyp  . Tubal ligation    . Cesarean section      x 3    Family History  Problem Relation Age of Onset  . Heart attack Paternal Grandfather   . Hypertension Father   .  Depression Sister   . Heart disease Son     coarctation of aorta repaired, Ross procedure, pulmonary graft, VSD repair    History   Social History  . Marital Status: Married    Spouse Name: N/A    Number of Children: 3  . Years of Education: N/A   Occupational History  . 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 Smoking status: Never Smoker   . Smokeless tobacco: Never Used  . Alcohol Use: No  . Drug Use: No  . Sexual Activity: Yes   Other Topics Concern  . Not on file   Social History Narrative  . No narrative on file    Current Outpatient Prescriptions on File Prior to Visit  Medication Sig Dispense Refill  . ALPRAZolam (XANAX) 0.25 MG tablet Take 1 tablet (0.25 mg total) by mouth 2 (two) times daily as needed for sleep.  20 tablet  0  . escitalopram (LEXAPRO) 10 MG tablet Take 1 tablet (10 mg total) by mouth daily. Take 0.5 tablet [5 mg total] PO daily for first 7 days  30 tablet  0   No current facility-administered medications on file prior to visit.    No Known Allergies  Review of Systems  Review of Systems  Constitutional: Positive for malaise/fatigue. Negative for fever.  HENT: Negative for congestion.   Eyes: Negative for discharge.  Respiratory: Negative for shortness of breath.   Cardiovascular: Negative for chest  pain, palpitations and leg swelling.  Gastrointestinal: Negative for nausea, abdominal pain, diarrhea and melena.  Genitourinary: Negative for dysuria.  Musculoskeletal: Negative for falls.  Skin: Positive for rash.  Neurological: Negative for loss of consciousness and headaches.  Endo/Heme/Allergies: Negative for polydipsia.  Psychiatric/Behavioral: Negative for depression and suicidal ideas. The patient is nervous/anxious. The patient does not have insomnia.     Objective  BP 130/82  Pulse 87  Temp(Src) 98.6 F (37 C) (Oral)  Ht 5\' 4"  (1.626 m)  Wt 167 lb (75.751 kg)  BMI 28.65 kg/m2  SpO2 95%  LMP  05/31/2013  Physical Exam  Physical Exam  Constitutional: She is oriented to person, place, and time and well-developed, well-nourished, and in no distress. No distress.  HENT:  Head: Normocephalic and atraumatic.  Eyes: Conjunctivae are normal.  Neck: Neck supple. No thyromegaly present.  Cardiovascular: Normal rate, regular rhythm and normal heart sounds.   No murmur heard. 2+ PT and DP pulses b/l  Pulmonary/Chest: Effort normal and breath sounds normal. She has no wheezes.  Abdominal: She exhibits no distension and no mass.  Musculoskeletal: She exhibits no edema.  Lymphadenopathy:    She has no cervical adenopathy.  Neurological: She is alert and oriented to person, place, and time.  Skin: Skin is warm and dry. No rash noted. She is not diaphoretic.  Psychiatric: Memory, affect and judgment normal.    Lab Results  Component Value Date   TSH 2.83 05/19/2013   Lab Results  Component Value Date   WBC 9.3 05/26/2013   HGB 13.1 05/26/2013   HCT 39.2 05/26/2013   MCV 86.4 05/26/2013   PLT 231.0 05/26/2013   Lab Results  Component Value Date   CREATININE 0.74 10/21/2012   BUN 9 10/21/2012   NA 138 10/21/2012   K 4.7 10/21/2012   CL 102 10/21/2012   CO2 30 10/21/2012   Lab Results  Component Value Date   ALT 12 10/21/2012   AST 16 10/21/2012   ALKPHOS 75 10/21/2012   BILITOT 0.4 10/21/2012   Lab Results  Component Value Date   CHOL 165 05/19/2013   Lab Results  Component Value Date   HDL 45.70 05/19/2013   Lab Results  Component Value Date   LDLCALC 104* 05/19/2013   Lab Results  Component Value Date   TRIG 77.0 05/19/2013   Lab Results  Component Value Date   CHOLHDL 4 05/19/2013     Assessment & Plan  Raynaud phenomenon Describes her toes and fingers getting cold and grey with cold and stillness. Resolves with movement. Offered reassurance can consider vascular surgery referral if she is unable to stop worrying.   Heart palpitations improved  Other and unspecified  hyperlipidemia Encouraged heart healthy diet, increase exercise, avoid trans fats, consider a krill oil cap daily

## 2013-06-15 NOTE — Assessment & Plan Note (Signed)
Encouraged heart healthy diet, increase exercise, avoid trans fats, consider a krill oil cap daily 

## 2013-06-15 NOTE — Assessment & Plan Note (Signed)
improved

## 2013-06-15 NOTE — Assessment & Plan Note (Signed)
Describes her toes and fingers getting cold and grey with cold and stillness. Resolves with movement. Offered reassurance can consider vascular surgery referral if she is unable to stop worrying.

## 2013-06-27 ENCOUNTER — Telehealth: Payer: Self-pay | Admitting: Family Medicine

## 2013-06-27 NOTE — Telephone Encounter (Signed)
Refill escitalopram

## 2013-06-28 MED ORDER — ESCITALOPRAM OXALATE 10 MG PO TABS: 10.0000 mg | ORAL_TABLET | Freq: Every day | ORAL | Status: DC

## 2013-07-11 ENCOUNTER — Encounter: Payer: Self-pay | Admitting: Cardiovascular Disease

## 2013-07-22 ENCOUNTER — Ambulatory Visit: Payer: Commercial Managed Care - PPO | Admitting: Cardiovascular Disease

## 2013-07-26 ENCOUNTER — Ambulatory Visit: Payer: Commercial Managed Care - PPO | Admitting: Internal Medicine

## 2013-08-02 ENCOUNTER — Encounter: Payer: Self-pay | Admitting: Family Medicine

## 2013-08-02 ENCOUNTER — Ambulatory Visit (INDEPENDENT_AMBULATORY_CARE_PROVIDER_SITE_OTHER): Payer: Commercial Managed Care - PPO | Admitting: Family Medicine

## 2013-08-02 VITALS — BP 118/64 | HR 87 | Temp 98.7°F | Ht 64.0 in | Wt 169.1 lb

## 2013-08-02 DIAGNOSIS — R002 Palpitations: Secondary | ICD-10-CM

## 2013-08-02 DIAGNOSIS — E663 Overweight: Secondary | ICD-10-CM

## 2013-08-02 NOTE — Patient Instructions (Signed)
DASH Diet  The DASH diet stands for "Dietary Approaches to Stop Hypertension." It is a healthy eating plan that has been shown to reduce high blood pressure (hypertension) in as little as 14 days, while also possibly providing other significant health benefits. These other health benefits include reducing the risk of breast cancer after menopause and reducing the risk of type 2 diabetes, heart disease, colon cancer, and stroke. Health benefits also include weight loss and slowing kidney failure in patients with chronic kidney disease.   DIET GUIDELINES  · Limit salt (sodium). Your diet should contain less than 1500 mg of sodium daily.  · Limit refined or processed carbohydrates. Your diet should include mostly whole grains. Desserts and added sugars should be used sparingly.  · Include small amounts of heart-healthy fats. These types of fats include nuts, oils, and tub margarine. Limit saturated and trans fats. These fats have been shown to be harmful in the body.  CHOOSING FOODS   The following food groups are based on a 2000 calorie diet. See your Registered Dietitian for individual calorie needs.  Grains and Grain Products (6 to 8 servings daily)  · Eat More Often: Whole-wheat bread, brown rice, whole-grain or wheat pasta, quinoa, popcorn without added fat or salt (air popped).  · Eat Less Often: White bread, white pasta, white rice, cornbread.  Vegetables (4 to 5 servings daily)  · Eat More Often: Fresh, frozen, and canned vegetables. Vegetables may be raw, steamed, roasted, or grilled with a minimal amount of fat.  · Eat Less Often/Avoid: Creamed or fried vegetables. Vegetables in a cheese sauce.  Fruit (4 to 5 servings daily)  · Eat More Often: All fresh, canned (in natural juice), or frozen fruits. Dried fruits without added sugar. One hundred percent fruit juice (½ cup [237 mL] daily).  · Eat Less Often: Dried fruits with added sugar. Canned fruit in light or heavy syrup.  Lean Meats, Fish, and Poultry (2  servings or less daily. One serving is 3 to 4 oz [85-114 g]).  · Eat More Often: Ninety percent or leaner ground beef, tenderloin, sirloin. Round cuts of beef, chicken breast, turkey breast. All fish. Grill, bake, or broil your meat. Nothing should be fried.  · Eat Less Often/Avoid: Fatty cuts of meat, turkey, or chicken leg, thigh, or wing. Fried cuts of meat or fish.  Dairy (2 to 3 servings)  · Eat More Often: Low-fat or fat-free milk, low-fat plain or light yogurt, reduced-fat or part-skim cheese.  · Eat Less Often/Avoid: Milk (whole, 2%). Whole milk yogurt. Full-fat cheeses.  Nuts, Seeds, and Legumes (4 to 5 servings per week)  · Eat More Often: All without added salt.  · Eat Less Often/Avoid: Salted nuts and seeds, canned beans with added salt.  Fats and Sweets (limited)  · Eat More Often: Vegetable oils, tub margarines without trans fats, sugar-free gelatin. Mayonnaise and salad dressings.  · Eat Less Often/Avoid: Coconut oils, palm oils, butter, stick margarine, cream, half and half, cookies, candy, pie.  FOR MORE INFORMATION  The Dash Diet Eating Plan: www.dashdiet.org  Document Released: 02/20/2011 Document Revised: 05/26/2011 Document Reviewed: 02/20/2011  ExitCare® Patient Information ©2014 [employer].

## 2013-08-02 NOTE — Progress Notes (Signed)
Pre visit review using our clinic review tool, if applicable. No additional management support is needed unless otherwise documented below in the visit note. 

## 2013-08-07 ENCOUNTER — Encounter: Payer: Self-pay | Admitting: Family Medicine

## 2013-08-07 DIAGNOSIS — E663 Overweight: Secondary | ICD-10-CM

## 2013-08-07 HISTORY — DX: Overweight: E66.3

## 2013-08-07 NOTE — Assessment & Plan Note (Signed)
Encouraged DASH diet, decrease po intake and increase exercise as tolerated. Needs 7-8 hours of sleep nightly. Avoid trans fats, eat small, frequent meals every 4-5 hours with lean proteins, complex carbs and healthy fats. Minimize simple carbs, GMO foods. Is considering taking a live of products called Thrive. Agrees to return for labs a month after starting supplements. There are many ingredients noted that are not commonly seen patient advised do not know with certainty the safety of these supplements. Stop if any concerning symptoms

## 2013-08-07 NOTE — Assessment & Plan Note (Addendum)
Well controlled recently, short lived when occurs

## 2013-08-07 NOTE — Progress Notes (Signed)
Patient ID: Maria GibneyMelissa Casey, female   DOB: 12/27/1975, 38 y.o.   MRN: [id number] Maria GibneyMelissa Casey [id number] 12/27/1975 08/07/2013      Progress Note-Follow Up  Subjective  Chief Complaint  Chief Complaint  Patient presents with  . Follow-up    wants to start a new vitamin supplement    HPI  Patient is a 38 year old female in today for routine medical care. 3 discuss supplement for weight loss. She notes her bowels are moving better with probiotics and changes. She notes palpitations are less frequent and without associated symptoms but she is concerned about weight gain. Product is called thrive and includes shakes and various supplements. She has not started it yet. Denies CP/palp/SOB/HA/congestion/fevers/GI or GU c/o. Taking meds as prescribed  Past Medical History  Diagnosis Date  . Palpitations   . SOB (shortness of breath)   . Chest pain   . History of chicken pox   . Menorrhagia 10/24/2012  . Preventative health care 10/24/2012  . Other and unspecified hyperlipidemia 10/24/2012  . Anxiety state, unspecified 10/24/2012  . Neck strain 11/28/2012  . Unspecified constipation 06/01/2013  . FH: diabetes mellitus 06/01/2013  . Low back pain 11/28/2012  . Raynaud phenomenon 06/15/2013  . Overweight 08/07/2013    Past Surgical History  Procedure Laterality Date  . Dilitation & currettage/hystroscopy with versapoint resection N/A 10/11/2012    Procedure: DILATATION & CURETTAGE/HYSTEROSCOPY WITH VERSAPOINT RESECTION;  Surgeon: Genia DelMarie-Lyne Lavoie, MD;  Location: [facility];  Service: Gynecology;  Laterality: N/A;  endometrial polyp  . Tubal ligation    . Cesarean section      x 3    Family History  Problem Relation Age of Onset  . Heart attack Paternal Grandfather   . Hypertension Father   . Depression Sister   . Heart disease Son     coarctation of aorta repaired, Ross procedure, pulmonary graft, VSD repair    History   Social History  . Marital Status: Married   Spouse Name: N/A    Number of Children: 3  . 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 Smoking status: Never Smoker   . Smokeless tobacco: Never Used  . Alcohol Use: No  . Drug Use: No  . Sexual Activity: Yes   Other Topics Concern  . Not on file   Social History Narrative  . No narrative on file    Current Outpatient Prescriptions on File Prior to Visit  Medication Sig Dispense Refill  . ALPRAZolam (XANAX) 0.25 MG tablet Take 1 tablet (0.25 mg total) by mouth 2 (two) times daily as needed for sleep.  20 tablet  0  . escitalopram (LEXAPRO) 10 MG tablet Take 1 tablet (10 mg total) by mouth daily.  30 tablet  4   No current facility-administered medications on file prior to visit.    No Known Allergies  Review of Systems  Review of Systems  Constitutional: Positive for malaise/fatigue. Negative for fever.  HENT: Negative for congestion.   Eyes: Negative for discharge.  Respiratory: Negative for shortness of breath.   Cardiovascular: Positive for palpitations. Negative for chest pain and leg swelling.  Gastrointestinal: Negative for nausea, abdominal pain and diarrhea.  Genitourinary: Negative for dysuria.  Musculoskeletal: Negative for falls.  Skin: Negative for rash.  Neurological: Negative for loss of consciousness and headaches.  Endo/Heme/Allergies: Negative for polydipsia.  Psychiatric/Behavioral: Negative for depression and suicidal ideas. The patient  is not nervous/anxious and does not have insomnia.     Objective  BP 118/64  Pulse 87  Temp(Src) 98.7 F (37.1 C) (Oral)  Ht 5\' 4"  (1.626 m)  Wt 169 lb 1.3 oz (76.694 kg)  BMI 29.01 kg/m2  SpO2 98%  LMP 07/10/2013  Physical Exam  Physical Exam  Constitutional: She is oriented to person, place, and time and well-developed, well-nourished, and in no distress. No distress.  HENT:  Head: Normocephalic and atraumatic.  Eyes:  Conjunctivae are normal.  Neck: Neck supple. No thyromegaly present.  Cardiovascular: Normal rate, regular rhythm and normal heart sounds.   No murmur heard. Pulmonary/Chest: Effort normal and breath sounds normal. She has no wheezes.  Abdominal: She exhibits no distension and no mass.  Musculoskeletal: She exhibits no edema.  Lymphadenopathy:    She has no cervical adenopathy.  Neurological: She is alert and oriented to person, place, and time.  Skin: Skin is warm and dry. No rash noted. She is not diaphoretic.  Psychiatric: Memory, affect and judgment normal.    Lab Results  Component Value Date   TSH 2.83 05/19/2013   Lab Results  Component Value Date   WBC 9.3 05/26/2013   HGB 13.1 05/26/2013   HCT 39.2 05/26/2013   MCV 86.4 05/26/2013   PLT 231.0 05/26/2013   Lab Results  Component Value Date   CREATININE 0.74 10/21/2012   BUN 9 10/21/2012   NA 138 10/21/2012   K 4.7 10/21/2012   CL 102 10/21/2012   CO2 30 10/21/2012   Lab Results  Component Value Date   ALT 12 10/21/2012   AST 16 10/21/2012   ALKPHOS 75 10/21/2012   BILITOT 0.4 10/21/2012   Lab Results  Component Value Date   CHOL 165 05/19/2013   Lab Results  Component Value Date   HDL 45.70 05/19/2013   Lab Results  Component Value Date   LDLCALC 104* 05/19/2013   Lab Results  Component Value Date   TRIG 77.0 05/19/2013   Lab Results  Component Value Date   CHOLHDL 4 05/19/2013     Assessment & Plan  Heart palpitations Well controlled recently, short lived when occurs  Overweight Encouraged DASH diet, decrease po intake and increase exercise as tolerated. Needs 7-8 hours of sleep nightly. Avoid trans fats, eat small, frequent meals every 4-5 hours with lean proteins, complex carbs and healthy fats. Minimize simple carbs, GMO foods. Is considering taking a live of products called Thrive. Agrees to return for labs a month after starting supplements. There are many ingredients noted that are not commonly seen patient advised do  not know with certainty the safety of these supplements. Stop if any concerning symptoms

## 2013-08-19 ENCOUNTER — Encounter: Payer: Self-pay | Admitting: Family Medicine

## 2013-08-19 ENCOUNTER — Ambulatory Visit (INDEPENDENT_AMBULATORY_CARE_PROVIDER_SITE_OTHER): Payer: Commercial Managed Care - PPO | Admitting: Family Medicine

## 2013-08-19 ENCOUNTER — Ambulatory Visit (HOSPITAL_BASED_OUTPATIENT_CLINIC_OR_DEPARTMENT_OTHER)
Admission: RE | Admit: 2013-08-19 | Discharge: 2013-08-19 | Disposition: A | Discharge status: Home / Self Care | Payer: Commercial Managed Care - PPO | Source: Ambulatory Visit | Attending: Family Medicine | Admitting: Family Medicine

## 2013-08-19 VITALS — BP 122/70 | HR 77 | Temp 97.8°F | Ht 64.0 in | Wt 168.1 lb

## 2013-08-19 DIAGNOSIS — K3189 Other diseases of stomach and duodenum: Secondary | ICD-10-CM

## 2013-08-19 DIAGNOSIS — J029 Acute pharyngitis, unspecified: Secondary | ICD-10-CM | POA: Insufficient documentation

## 2013-08-19 DIAGNOSIS — R1013 Epigastric pain: Secondary | ICD-10-CM

## 2013-08-19 DIAGNOSIS — M542 Cervicalgia: Secondary | ICD-10-CM | POA: Insufficient documentation

## 2013-08-19 DIAGNOSIS — R220 Localized swelling, mass and lump, head: Secondary | ICD-10-CM | POA: Insufficient documentation

## 2013-08-19 DIAGNOSIS — K5900 Constipation, unspecified: Secondary | ICD-10-CM

## 2013-08-19 DIAGNOSIS — E663 Overweight: Secondary | ICD-10-CM

## 2013-08-19 DIAGNOSIS — R221 Localized swelling, mass and lump, neck: Secondary | ICD-10-CM

## 2013-08-19 LAB — CBC
HEMATOCRIT: 38.1 % (ref 36.0–46.0)
HEMOGLOBIN: 12.9 g/dL (ref 12.0–15.0)
MCH: 28.7 pg (ref 26.0–34.0)
MCHC: 33.9 g/dL (ref 30.0–36.0)
MCV: 84.9 fL (ref 78.0–100.0)
Platelets: 257 10*3/uL (ref 150–400)
RBC: 4.49 MIL/uL (ref 3.87–5.11)
RDW: 14.6 % (ref 11.5–15.5)
WBC: 6.6 10*3/uL (ref 4.0–10.5)

## 2013-08-19 MED ORDER — RANITIDINE HCL 300 MG PO TABS: 300.0000 mg | ORAL_TABLET | Freq: Every evening | ORAL | Status: DC | PRN

## 2013-08-19 NOTE — Patient Instructions (Signed)
Gastroesophageal Reflux Disease, Adult  Gastroesophageal reflux disease (GERD) happens when acid from your stomach flows up into the esophagus. When acid comes in contact with the esophagus, the acid causes soreness (inflammation) in the esophagus. Over time, GERD may create small holes (ulcers) in the lining of the esophagus.  CAUSES   · Increased body weight. This puts pressure on the stomach, making acid rise from the stomach into the esophagus.  · Smoking. This increases acid production in the stomach.  · Drinking alcohol. This causes decreased pressure in the lower esophageal sphincter (valve or ring of muscle between the esophagus and stomach), allowing acid from the stomach into the esophagus.  · Late evening meals and a full stomach. This increases pressure and acid production in the stomach.  · A malformed lower esophageal sphincter.  Sometimes, no cause is found.  SYMPTOMS   · Burning pain in the lower part of the mid-chest behind the breastbone and in the mid-stomach area. This may occur twice a week or more often.  · Trouble swallowing.  · Sore throat.  · Dry cough.  · Asthma-like symptoms including chest tightness, shortness of breath, or wheezing.  DIAGNOSIS   Your caregiver may be able to diagnose GERD based on your symptoms. In some cases, X-rays and other tests may be done to check for complications or to check the condition of your stomach and esophagus.  TREATMENT   Your caregiver may recommend over-the-counter or prescription medicines to help decrease acid production. Ask your caregiver before starting or adding any new medicines.   HOME CARE INSTRUCTIONS   · Change the factors that you can control. Ask your caregiver for guidance concerning weight loss, quitting smoking, and alcohol consumption.  · Avoid foods and drinks that make your symptoms worse, such as:  · Caffeine or alcoholic drinks.  · Chocolate.  · Peppermint or mint flavorings.  · Garlic and onions.  · Spicy foods.  · Citrus fruits,  such as oranges, lemons, or limes.  · Tomato-based foods such as sauce, chili, salsa, and pizza.  · Fried and fatty foods.  · Avoid lying down for the 3 hours prior to your bedtime or prior to taking a nap.  · Eat small, frequent meals instead of large meals.  · Wear loose-fitting clothing. Do not wear anything tight around your waist that causes pressure on your stomach.  · Raise the head of your bed 6 to 8 inches with wood blocks to help you sleep. Extra pillows will not help.  · Only take over-the-counter or prescription medicines for pain, discomfort, or fever as directed by your caregiver.  · Do not take aspirin, ibuprofen, or other nonsteroidal anti-inflammatory drugs (NSAIDs).  SEEK IMMEDIATE MEDICAL CARE IF:   · You have pain in your arms, neck, jaw, teeth, or back.  · Your pain increases or changes in intensity or duration.  · You develop nausea, vomiting, or sweating (diaphoresis).  · You develop shortness of breath, or you faint.  · Your vomit is green, yellow, black, or looks like coffee grounds or blood.  · Your stool is red, bloody, or black.  These symptoms could be signs of other problems, such as heart disease, gastric bleeding, or esophageal bleeding.  MAKE SURE YOU:   · Understand these instructions.  · Will watch your condition.  · Will get help right away if you are not doing well or get worse.  Document Released: 12/11/2004 Document Revised: 05/26/2011 Document Reviewed: 09/20/2010  ExitCare® Patient   Information ©2014 [employer].

## 2013-08-19 NOTE — Progress Notes (Signed)
Pre visit review using our clinic review tool, if applicable. No additional management support is needed unless otherwise documented below in the visit note. 

## 2013-08-19 NOTE — Progress Notes (Signed)
Patient ID: Maria Casey, female   DOB: 10/04/1975, 38 y.o.   MRN: [id number] Maria Casey [id number] 1975/10/24 08/19/2013      Progress Note-Follow Up  Subjective  Chief Complaint  Chief Complaint  Patient presents with  . Otitis Media  . Sore Throat    HPI  Patient is a 38 year old female in today for routine medical care. In today with a couple of complaints. She's noted a change in bowel habits and some dyspepsia largely since starting her supplements for weight loss. She has dyspepsia and epigastric discomfort. Some mild dyspepsia is noted. Denies sore throat or sour fluid in the throat. No bloody or tarry stool. Has a bowel movement generally every 1-2 days but is having to strain. Is also noting some discomfort in her neck. She has some tension in swelling on occasion but no dysphagia or injury. Denies CP/palp/SOB/HA/congestion/fevers or GU c/o. Taking meds as prescribed  Past Medical History  Diagnosis Date  . Palpitations   . SOB (shortness of breath)   . Chest pain   . History of chicken pox   . Menorrhagia 10/24/2012  . Preventative health care 10/24/2012  . Other and unspecified hyperlipidemia 10/24/2012  . Anxiety state, unspecified 10/24/2012  . Neck strain 11/28/2012  . Unspecified constipation 06/01/2013  . FH: diabetes mellitus 06/01/2013  . Low back pain 11/28/2012  . Raynaud phenomenon 06/15/2013  . Overweight 08/07/2013    Past Surgical History  Procedure Laterality Date  . Dilitation & currettage/hystroscopy with versapoint resection N/A 10/11/2012    Procedure: DILATATION & CURETTAGE/HYSTEROSCOPY WITH VERSAPOINT RESECTION;  Surgeon: Genia Del, MD;  Location: [facility];  Service: Gynecology;  Laterality: N/A;  endometrial polyp  . Tubal ligation    . Cesarean section      x 3    Family History  Problem Relation Age of Onset  . Heart attack Paternal Grandfather   . Hypertension Father   . Depression Sister   . Heart disease Son      coarctation of aorta repaired, Ross procedure, pulmonary graft, VSD repair    History   Social History  . Marital Status: Married    Spouse Name: N/A    Number of Children: 3  . Years of Education: N/A   Occupational History  . NURSING SECRETARY  [employer]   Social History Main Topics  . Smoking status: Never Smoker   . Smokeless tobacco: Never Used  . Alcohol Use: No  . Drug Use: No  . Sexual Activity: Yes   Other Topics Concern  . Not on file   Social History Narrative  . No narrative on file    Current Outpatient Prescriptions on File Prior to Visit  Medication Sig Dispense Refill  . ALPRAZolam (XANAX) 0.25 MG tablet Take 1 tablet (0.25 mg total) by mouth 2 (two) times daily as needed for sleep.  20 tablet  0  . escitalopram (LEXAPRO) 10 MG tablet Take 1 tablet (10 mg total) by mouth daily.  30 tablet  4   No current facility-administered medications on file prior to visit.    No Known Allergies  Review of Systems  Review of Systems  Constitutional: Negative for fever and malaise/fatigue.  HENT: Positive for sore throat. Negative for congestion.   Eyes: Negative for discharge.  Respiratory: Negative for shortness of breath.   Cardiovascular: Positive for chest pain. Negative for palpitations and leg swelling.  Gastrointestinal: Negative for nausea, abdominal pain and diarrhea.  Genitourinary: Negative for dysuria.  Musculoskeletal: Positive for neck pain. Negative for falls.  Skin: Negative for rash.  Neurological: Negative for loss of consciousness and headaches.  Endo/Heme/Allergies: Negative for polydipsia.  Psychiatric/Behavioral: Negative for depression and suicidal ideas. The patient is not nervous/anxious and does not have insomnia.     Objective  BP 122/70  Pulse 77  Temp(Src) 97.8 F (36.6 C) (Oral)  Ht 5\' 4"  (1.626 m)  Wt 168 lb 1.3 oz (76.241 kg)  BMI 28.84 kg/m2  SpO2 98%  LMP 07/10/2013  Physical Exam  Physical Exam   Constitutional: She is oriented to person, place, and time and well-developed, well-nourished, and in no distress. No distress.  HENT:  Head: Normocephalic and atraumatic.  Eyes: Conjunctivae are normal.  Neck: Neck supple. No thyromegaly present.  Cardiovascular: Normal rate, regular rhythm and normal heart sounds.   No murmur heard. Pulmonary/Chest: Effort normal and breath sounds normal. She has no wheezes.  Abdominal: She exhibits no distension and no mass.  Musculoskeletal: She exhibits no edema.  Lymphadenopathy:    She has no cervical adenopathy.  Neurological: She is alert and oriented to person, place, and time.  Skin: Skin is warm and dry. No rash noted. She is not diaphoretic.  Psychiatric: Memory, affect and judgment normal.    Lab Results  Component Value Date   TSH 2.83 05/19/2013   Lab Results  Component Value Date   WBC 9.3 05/26/2013   HGB 13.1 05/26/2013   HCT 39.2 05/26/2013   MCV 86.4 05/26/2013   PLT 231.0 05/26/2013   Lab Results  Component Value Date   CREATININE 0.74 10/21/2012   BUN 9 10/21/2012   NA 138 10/21/2012   K 4.7 10/21/2012   CL 102 10/21/2012   CO2 30 10/21/2012   Lab Results  Component Value Date   ALT 12 10/21/2012   AST 16 10/21/2012   ALKPHOS 75 10/21/2012   BILITOT 0.4 10/21/2012   Lab Results  Component Value Date   CHOL 165 05/19/2013   Lab Results  Component Value Date   HDL 45.70 05/19/2013   Lab Results  Component Value Date   LDLCALC 104* 05/19/2013   Lab Results  Component Value Date   TRIG 77.0 05/19/2013   Lab Results  Component Value Date   CHOLHDL 4 05/19/2013     Assessment & Plan    Unspecified constipation Encouraged increased hydration and fiber in diet. Daily probiotics. If bowels not moving can use MOM 2 tbls po in 4 oz of warm prune juice by mouth every 2-3 days. If no results then repeat in 4 hours with  Dulcolax suppository pr, may repeat again in 4 more hours as needed. Seek care if symptoms worsen. Consider daily  Miralax and/or Dulcolax if symptoms persist.   Overweight Encouraged DASH diet, decrease po intake and increase exercise as tolerated. Needs 7-8 hours of sleep nightly. Avoid trans fats, eat small, frequent meals every 4-5 hours with lean proteins, complex carbs and healthy fats. Minimize simple carbs, GMO foods. Has been noting some change in bowel habits since starting her Thrive supplements. Encouraged to stop them for the next week or so to see if it helps  Dyspepsia H Pylori negative, start Ranitidine and stop supplements temporarily to see if helpful  Neck pain Check ultrasound of neck. Encouraged moist heat and gentle stretching as tolerated. May try NSAIDs and prescription meds as directed and report if symptoms worsen or seek immediate care

## 2013-08-22 LAB — H. PYLORI ANTIBODY, IGG: H Pylori IgG: <0.4 {ISR}

## 2013-08-28 DIAGNOSIS — M542 Cervicalgia: Secondary | ICD-10-CM | POA: Insufficient documentation

## 2013-08-28 DIAGNOSIS — R1013 Epigastric pain: Secondary | ICD-10-CM | POA: Insufficient documentation

## 2013-08-28 NOTE — Assessment & Plan Note (Signed)
H Pylori negative, start Ranitidine and stop supplements temporarily to see if helpful

## 2013-08-28 NOTE — Assessment & Plan Note (Signed)
Encouraged increased hydration and fiber in diet. Daily probiotics. If bowels not moving can use MOM 2 tbls po in 4 oz of warm prune juice by mouth every 2-3 days. If no results then repeat in 4 hours with  Dulcolax suppository pr, may repeat again in 4 more hours as needed. Seek care if symptoms worsen. Consider daily Miralax and/or Dulcolax if symptoms persist.  

## 2013-08-28 NOTE — Assessment & Plan Note (Signed)
Check ultrasound of neck. Encouraged moist heat and gentle stretching as tolerated. May try NSAIDs and prescription meds as directed and report if symptoms worsen or seek immediate care

## 2013-08-28 NOTE — Assessment & Plan Note (Signed)
Encouraged DASH diet, decrease po intake and increase exercise as tolerated. Needs 7-8 hours of sleep nightly. Avoid trans fats, eat small, frequent meals every 4-5 hours with lean proteins, complex carbs and healthy fats. Minimize simple carbs, GMO foods. Has been noting some change in bowel habits since starting her Thrive supplements. Encouraged to stop them for the next week or so to see if it helps

## 2013-09-13 ENCOUNTER — Ambulatory Visit: Payer: Commercial Managed Care - PPO | Admitting: Cardiovascular Disease

## 2013-10-06 ENCOUNTER — Ambulatory Visit: Payer: Commercial Managed Care - PPO | Admitting: Family Medicine

## 2013-10-31 ENCOUNTER — Encounter: Payer: Self-pay | Admitting: Family Medicine

## 2013-10-31 DIAGNOSIS — R1013 Epigastric pain: Secondary | ICD-10-CM

## 2013-10-31 DIAGNOSIS — E785 Hyperlipidemia, unspecified: Secondary | ICD-10-CM

## 2013-10-31 DIAGNOSIS — Z79899 Other long term (current) drug therapy: Secondary | ICD-10-CM

## 2013-11-28 ENCOUNTER — Other Ambulatory Visit (INDEPENDENT_AMBULATORY_CARE_PROVIDER_SITE_OTHER): Payer: Commercial Managed Care - PPO

## 2013-11-28 DIAGNOSIS — Z79899 Other long term (current) drug therapy: Secondary | ICD-10-CM

## 2013-11-28 DIAGNOSIS — E785 Hyperlipidemia, unspecified: Secondary | ICD-10-CM

## 2013-11-28 DIAGNOSIS — K3189 Other diseases of stomach and duodenum: Secondary | ICD-10-CM

## 2013-11-28 DIAGNOSIS — R1013 Epigastric pain: Secondary | ICD-10-CM

## 2013-11-28 LAB — LIPID PANEL
CHOLESTEROL: 138 mg/dL (ref 0–200)
HDL: 41.7 mg/dL (ref >39.00)
LDL CALC: 87 mg/dL (ref 0–99)
NonHDL: 96.3
TRIGLYCERIDES: 49 mg/dL (ref 0.0–149.0)
Total CHOL/HDL Ratio: 3
VLDL: 9.8 mg/dL (ref 0.0–40.0)

## 2013-11-28 LAB — HEPATIC FUNCTION PANEL
ALT: 14 U/L (ref 0–35)
AST: 20 U/L (ref 0–37)
Albumin: 3.8 g/dL (ref 3.5–5.2)
Alkaline Phosphatase: 60 U/L (ref 39–117)
BILIRUBIN TOTAL: 0.4 mg/dL (ref 0.2–1.2)
Bilirubin, Direct: 0 mg/dL (ref 0.0–0.3)
Total Protein: 7.8 g/dL (ref 6.0–8.3)

## 2013-11-28 LAB — RENAL FUNCTION PANEL
Albumin: 3.8 g/dL (ref 3.5–5.2)
BUN: 12 mg/dL (ref 6–23)
CO2: 28 meq/L (ref 19–32)
Calcium: 9.1 mg/dL (ref 8.4–10.5)
Chloride: 103 mEq/L (ref 96–112)
Creatinine, Ser: 0.8 mg/dL (ref 0.4–1.2)
GFR: 90.54 mL/min (ref >60.00)
GLUCOSE: 98 mg/dL (ref 70–99)
Phosphorus: 3 mg/dL (ref 2.3–4.6)
Potassium: 4 mEq/L (ref 3.5–5.1)
Sodium: 138 mEq/L (ref 135–145)

## 2013-11-28 LAB — CBC
HEMATOCRIT: 38.7 % (ref 36.0–46.0)
Hemoglobin: 12.8 g/dL (ref 12.0–15.0)
MCHC: 33.2 g/dL (ref 30.0–36.0)
MCV: 85.6 fl (ref 78.0–100.0)
Platelets: 246 10*3/uL (ref 150.0–400.0)
RBC: 4.52 Mil/uL (ref 3.87–5.11)
RDW: 15.1 % (ref 11.5–15.5)
WBC: 9.1 10*3/uL (ref 4.0–10.5)

## 2013-11-28 LAB — TSH: TSH: 4.2 u[IU]/mL (ref 0.35–4.50)

## 2013-11-28 NOTE — Addendum Note (Signed)
Addended by: Court Joy on: 11/28/2013 09:24 AM   Modules accepted: Orders

## 2013-11-28 NOTE — Addendum Note (Signed)
Addended by: Court Joy on: 11/28/2013 02:49 PM   Modules accepted: Orders

## 2013-12-19 ENCOUNTER — Encounter: Payer: Self-pay | Admitting: Family Medicine

## 2013-12-20 ENCOUNTER — Telehealth: Payer: Self-pay

## 2013-12-20 DIAGNOSIS — E663 Overweight: Secondary | ICD-10-CM

## 2013-12-20 NOTE — Telephone Encounter (Signed)
Lab order placed.

## 2013-12-30 ENCOUNTER — Other Ambulatory Visit: Payer: Self-pay

## 2014-02-14 ENCOUNTER — Encounter: Payer: Self-pay | Admitting: Family Medicine

## 2014-02-14 ENCOUNTER — Ambulatory Visit (INDEPENDENT_AMBULATORY_CARE_PROVIDER_SITE_OTHER): Payer: Commercial Managed Care - PPO | Admitting: Family Medicine

## 2014-02-14 VITALS — BP 120/66 | HR 77 | Temp 98.4°F | Ht 64.0 in | Wt 164.2 lb

## 2014-02-14 DIAGNOSIS — E663 Overweight: Secondary | ICD-10-CM

## 2014-02-14 DIAGNOSIS — R1310 Dysphagia, unspecified: Secondary | ICD-10-CM

## 2014-02-14 DIAGNOSIS — M542 Cervicalgia: Secondary | ICD-10-CM

## 2014-02-14 MED ORDER — CYCLOBENZAPRINE HCL 5 MG PO TABS: 5.0000 mg | ORAL_TABLET | Freq: Every evening | ORAL | Status: DC | PRN

## 2014-02-14 NOTE — Progress Notes (Signed)
Pre visit review using our clinic review tool, if applicable. No additional management support is needed unless otherwise documented below in the visit note. 

## 2014-02-14 NOTE — Patient Instructions (Signed)
Back Pain, Adult Low back pain is very common. About 1 in 5 people have back pain.The cause of low back pain is rarely dangerous. The pain often gets better over time.About half of people with a sudden onset of back pain feel better in just 2 weeks. About 8 in 10 people feel better by 6 weeks.  CAUSES Some common causes of back pain include:  Strain of the muscles or ligaments supporting the spine.  Wear and tear (degeneration) of the spinal discs.  Arthritis.  Direct injury to the back. DIAGNOSIS Most of the time, the direct cause of low back pain is not known.However, back pain can be treated effectively even when the exact cause of the pain is unknown.Answering your caregiver's questions about your overall health and symptoms is one of the most accurate ways to make sure the cause of your pain is not dangerous. If your caregiver needs more information, he or she may order lab work or imaging tests (X-rays or MRIs).However, even if imaging tests show changes in your back, this usually does not require surgery. HOME CARE INSTRUCTIONS For many people, back pain returns.Since low back pain is rarely dangerous, it is often a condition that people can learn to manageon their own.   Remain active. It is stressful on the back to sit or stand in one place. Do not sit, drive, or stand in one place for more than 30 minutes at a time. Take short walks on level surfaces as soon as pain allows.Try to increase the length of time you walk each day.  Do not stay in bed.Resting more than 1 or 2 days can delay your recovery.  Do not avoid exercise or work.Your body is made to move.It is not dangerous to be active, even though your back may hurt.Your back will likely heal faster if you return to being active before your pain is gone.  Pay attention to your body when you bend and lift. Many people have less discomfortwhen lifting if they bend their knees, keep the load close to their bodies,and  avoid twisting. Often, the most comfortable positions are those that put less stress on your recovering back.  Find a comfortable position to sleep. Use a firm mattress and lie on your side with your knees slightly bent. If you lie on your back, put a pillow under your knees.  Only take over-the-counter or prescription medicines as directed by your caregiver. Over-the-counter medicines to reduce pain and inflammation are often the most helpful.Your caregiver may prescribe muscle relaxant drugs.These medicines help dull your pain so you can more quickly return to your normal activities and healthy exercise.  Put ice on the injured area.  Put ice in a plastic bag.  Place a towel between your skin and the bag.  Leave the ice on for 15-20 minutes, 03-04 times a day for the first 2 to 3 days. After that, ice and heat may be alternated to reduce pain and spasms.  Ask your caregiver about trying back exercises and gentle massage. This may be of some benefit.  Avoid feeling anxious or stressed.Stress increases muscle tension and can worsen back pain.It is important to recognize when you are anxious or stressed and learn ways to manage it.Exercise is a great option. SEEK MEDICAL CARE IF:  You have pain that is not relieved with rest or medicine.  You have pain that does not improve in 1 week.  You have new symptoms.  You are generally not feeling well. SEEK   IMMEDIATE MEDICAL CARE IF:   You have pain that radiates from your back into your legs.  You develop new bowel or bladder control problems.  You have unusual weakness or numbness in your arms or legs.  You develop nausea or vomiting.  You develop abdominal pain.  You feel faint. Document Released: 03/03/2005 Document Revised: 09/02/2011 Document Reviewed: 07/05/2013 [employer] Patient Information 2015 [employer]. This information is not intended to replace advice given to you by your health care provider. Make sure you  discuss any questions you have with your health care provider.  

## 2014-02-15 LAB — RENAL FUNCTION PANEL
Albumin: 4.2 g/dL (ref 3.5–5.2)
BUN: 13 mg/dL (ref 6–23)
CO2: 25 mEq/L (ref 19–32)
CREATININE: 0.7 mg/dL (ref 0.4–1.2)
Calcium: 9.3 mg/dL (ref 8.4–10.5)
Chloride: 104 mEq/L (ref 96–112)
GFR: 96.26 mL/min (ref >60.00)
GLUCOSE: 84 mg/dL (ref 70–99)
PHOSPHORUS: 3.3 mg/dL (ref 2.3–4.6)
Potassium: 5.3 mEq/L — ABNORMAL HIGH (ref 3.5–5.1)
SODIUM: 135 meq/L (ref 135–145)

## 2014-02-15 LAB — HEPATIC FUNCTION PANEL
ALT: 15 U/L (ref 0–35)
AST: 22 U/L (ref 0–37)
Albumin: 4.2 g/dL (ref 3.5–5.2)
Alkaline Phosphatase: 56 U/L (ref 39–117)
Bilirubin, Direct: 0 mg/dL (ref 0.0–0.3)
TOTAL PROTEIN: 7.8 g/dL (ref 6.0–8.3)
Total Bilirubin: 0.4 mg/dL (ref 0.2–1.2)

## 2014-02-15 LAB — TSH: TSH: 3.03 u[IU]/mL (ref 0.35–4.50)

## 2014-02-15 LAB — T4, FREE: Free T4: 0.82 ng/dL (ref 0.60–1.60)

## 2014-02-19 ENCOUNTER — Encounter: Payer: Self-pay | Admitting: Family Medicine

## 2014-02-19 NOTE — Assessment & Plan Note (Signed)
Encouraged moist heat and gentle stretching as tolerated. May try NSAIDs and prescription meds as directed and report if symptoms worsen or seek immediate care. consider chiropractic and call if worsens

## 2014-02-19 NOTE — Progress Notes (Signed)
Maria GibneyMelissa Casey Casey  [id number] [birth year] 02/19/2014      Progress Note-Follow Up  Subjective  Chief Complaint  Chief Complaint  Patient presents with  . Neck Pain    X on and off since last visit  . Ear Pain    X on and off since last visit    HPI  Patient is a 38 y.o. female in today for routine medical care. Patient is in today c/o neck pain and ear pain. No congestion, fevers, chills, headache, sore throat or nasal congestion. No recent injury or falls. No radicular symptoms. Denies CP/palp/SOB/HA/congestion/fevers/GI or GU c/o. Taking meds as prescribed  Past Medical History  Diagnosis Date  . Palpitations   . SOB (shortness of breath)   . Chest pain   . History of chicken pox   . Menorrhagia 10/24/2012  . Preventative health care 10/24/2012  . Other and unspecified hyperlipidemia 10/24/2012  . Anxiety state, unspecified 10/24/2012  . Neck strain 11/28/2012  . Unspecified constipation 06/01/2013  . FH: diabetes mellitus 06/01/2013  . Low back pain 11/28/2012  . Raynaud phenomenon 06/15/2013  . Overweight(278.02) 08/07/2013    Past Surgical History  Procedure Laterality Date  . Dilitation & currettage/hystroscopy with versapoint resection N/A 10/11/2012    Procedure: DILATATION & CURETTAGE/HYSTEROSCOPY WITH VERSAPOINT RESECTION;  Surgeon: Genia DelMarie-Lyne Lavoie, MD;  Location: [facility];  Service: Gynecology;  Laterality: N/A;  endometrial polyp  . Tubal ligation    . Cesarean section      x 3    Family History  Problem Relation Age of Onset  . Heart attack Paternal Grandfather   . Hypertension Father   . Depression Sister   . Heart disease Son     coarctation of aorta repaired, Ross procedure, pulmonary graft, VSD repair    History   Social History  . Marital Status: Married    Spouse Name: N/A    Number of Children: 3  . 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 Smoking status: Never  Smoker   . Smokeless tobacco: Never Used  . Alcohol Use: No  . Drug Use: No  . Sexual Activity: Yes   Other Topics Concern  . Not on file   Social History Narrative    Current Outpatient Prescriptions on File Prior to Visit  Medication Sig Dispense Refill  . ALPRAZolam (XANAX) 0.25 MG tablet Take 1 tablet (0.25 mg total) by mouth 2 (two) times daily as needed for sleep. 20 tablet 0  . escitalopram (LEXAPRO) 10 MG tablet Take 1 tablet (10 mg total) by mouth daily. 30 tablet 4  . ranitidine (ZANTAC) 300 MG tablet Take 1 tablet (300 mg total) by mouth at bedtime as needed for heartburn. 30 tablet 1   No current facility-administered medications on file prior to visit.    No Known Allergies  Review of Systems  Review of Systems  Constitutional: Negative for fever and malaise/fatigue.  HENT: Positive for ear pain. Negative for congestion, ear discharge, hearing loss and tinnitus.   Eyes: Negative for discharge.  Respiratory: Negative for shortness of breath.   Cardiovascular: Negative for chest pain, palpitations and leg swelling.  Gastrointestinal: Negative for nausea, abdominal pain and diarrhea.  Genitourinary: Negative for dysuria.  Musculoskeletal: Positive for neck pain. Negative for falls.  Skin: Negative for rash.  Neurological: Negative for loss of consciousness and headaches.  Endo/Heme/Allergies: Negative for polydipsia.  Psychiatric/Behavioral: Negative  for depression and suicidal ideas. The patient is not nervous/anxious and does not have insomnia.     Objective  BP 120/66 mmHg  Pulse 77  Temp(Src) 98.4 F (36.9 C) (Oral)  Ht 5\' 4"  (1.626 m)  Wt 164 lb 3.2 oz (74.481 kg)  BMI 28.17 kg/m2  SpO2 100%  LMP 02/14/2014  Physical Exam  Physical Exam  Constitutional: She is oriented to person, place, and time and well-developed, well-nourished, and in no distress. No distress.  HENT:  Head: Normocephalic and atraumatic.  Eyes: Conjunctivae are normal.    Neck: Neck supple. No thyromegaly present.  Cardiovascular: Normal rate, regular rhythm and normal heart sounds.   No murmur heard. Pulmonary/Chest: Effort normal and breath sounds normal. She has no wheezes.  Abdominal: She exhibits no distension and no mass.  Musculoskeletal: She exhibits no edema.  Lymphadenopathy:    She has no cervical adenopathy.  Neurological: She is alert and oriented to person, place, and time.  Skin: Skin is warm and dry. No rash noted. She is not diaphoretic.  Psychiatric: Memory, affect and judgment normal.    Lab Results  Component Value Date   TSH 3.03 02/14/2014   Lab Results  Component Value Date   WBC 9.1 11/28/2013   HGB 12.8 11/28/2013   HCT 38.7 11/28/2013   MCV 85.6 11/28/2013   PLT 246.0 11/28/2013   Lab Results  Component Value Date   CREATININE 0.7 02/14/2014   BUN 13 02/14/2014   NA 135 02/14/2014   K 5.3* 02/14/2014   CL 104 02/14/2014   CO2 25 02/14/2014   Lab Results  Component Value Date   ALT 15 02/14/2014   AST 22 02/14/2014   ALKPHOS 56 02/14/2014   BILITOT 0.4 02/14/2014   Lab Results  Component Value Date   CHOL 138 11/28/2013   Lab Results  Component Value Date   HDL 41.70 11/28/2013   Lab Results  Component Value Date   LDLCALC 87 11/28/2013   Lab Results  Component Value Date   TRIG 49.0 11/28/2013   Lab Results  Component Value Date   CHOLHDL 3 11/28/2013     Assessment & Plan  Overweight Encouraged DASH diet, decrease po intake and increase exercise as tolerated. Needs 7-8 hours of sleep nightly. Avoid trans fats, eat small, frequent meals every 4-5 hours with lean proteins, complex carbs and healthy fats. Minimize simple carbs, GMO foods. Has been taking a supplement OTC, check renal and liver panels today  Neck pain Encouraged moist heat and gentle stretching as tolerated. May try NSAIDs and prescription meds as directed and report if symptoms worsen or seek immediate care. consider  chiropractic and call if worsens

## 2014-02-19 NOTE — Assessment & Plan Note (Signed)
Encouraged DASH diet, decrease po intake and increase exercise as tolerated. Needs 7-8 hours of sleep nightly. Avoid trans fats, eat small, frequent meals every 4-5 hours with lean proteins, complex carbs and healthy fats. Minimize simple carbs, GMO foods. Has been taking a supplement OTC, check renal and liver panels today

## 2014-08-15 ENCOUNTER — Encounter: Payer: Self-pay | Admitting: Physician Assistant

## 2014-08-15 ENCOUNTER — Ambulatory Visit (INDEPENDENT_AMBULATORY_CARE_PROVIDER_SITE_OTHER): Payer: Commercial Managed Care - PPO | Admitting: Physician Assistant

## 2014-08-15 VITALS — BP 118/63 | HR 83 | Temp 98.0°F | Ht 64.0 in | Wt 166.2 lb

## 2014-08-15 DIAGNOSIS — R590 Localized enlarged lymph nodes: Secondary | ICD-10-CM | POA: Diagnosis not present

## 2014-08-15 DIAGNOSIS — M2669 Other specified disorders of temporomandibular joint: Secondary | ICD-10-CM

## 2014-08-15 DIAGNOSIS — M26629 Arthralgia of temporomandibular joint, unspecified side: Secondary | ICD-10-CM | POA: Insufficient documentation

## 2014-08-15 NOTE — Patient Instructions (Signed)
Please go to the lab for blood work.  I will call you with your results. Please apply Aspercreme around the ear and neck. Take an Aleve twice daily. Apply ice to the area Avoid eating on the affected side. Follow-up with ENT if not improving.

## 2014-08-15 NOTE — Assessment & Plan Note (Signed)
Aleve twice daily. ICE to area. Topical Aspercreme to the area.  Follow-up with ENT.

## 2014-08-15 NOTE — Progress Notes (Signed)
Pre visit review using our clinic review tool, if applicable. No additional management support is needed unless otherwise documented below in the visit note. 

## 2014-08-15 NOTE — Assessment & Plan Note (Signed)
Left anterior cervical node palpable only just barely so.  Does not feel pathologic.  No signs/symptoms of infection noted. Will check CBC w diff today. Will proceed with further workup based on results and persistence of node

## 2014-08-15 NOTE — Progress Notes (Signed)
    Patient presents to clinic today c/o intermittent R ear and jaw pain over the past several months. Also notes swollen lymph node of her neck first noticed this week.  Denies fever, chills, sore throat, or other URI symptoms.  Denies trauma or injury to neck, jaw or ear.  Past Medical History  Diagnosis Date  . Palpitations   . SOB (shortness of breath)   . Chest pain   . History of chicken pox   . Menorrhagia 10/24/2012  . Preventative health care 10/24/2012  . Other and unspecified hyperlipidemia 10/24/2012  . Anxiety state, unspecified 10/24/2012  . Neck strain 11/28/2012  . Unspecified constipation 06/01/2013  . FH: diabetes mellitus 06/01/2013  . Low back pain 11/28/2012  . Raynaud phenomenon 06/15/2013  . Overweight(278.02) 08/07/2013    No current outpatient prescriptions on file prior to visit.   No current facility-administered medications on file prior to visit.    No Known Allergies  Family History  Problem Relation Age of Onset  . Heart attack Paternal Grandfather   . Hypertension Father   . Depression Sister   . Heart disease Son     coarctation of aorta repaired, Ross procedure, pulmonary graft, VSD repair    History   Social History  . Marital Status: Married    Spouse Name: N/A  . Number of Children: 3  . 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 Smoking status: Never Smoker   . Smokeless tobacco: Never Used  . Alcohol Use: No  . Drug Use: No  . Sexual Activity: Yes   Other Topics Concern  . None   Social History Narrative   Review of Systems - See HPI.  All other ROS are negative.  BP 118/63 mmHg  Pulse 83  Temp(Src) 98 F (36.7 C) (Oral)  Ht 5\' 4"  (1.626 m)  Wt 166 lb 3.2 oz (75.388 kg)  BMI 28.51 kg/m2  SpO2 100%  LMP 07/31/2014  Physical Exam  Constitutional: She is oriented to person, place, and time and well-developed, well-nourished, and in no distress.    HENT:  Head: Normocephalic and atraumatic.  Right Ear: External ear normal.  Left Ear: External ear normal.  Nose: Nose normal.  Mouth/Throat: Oropharynx is clear and moist. No oropharyngeal exudate.  L sided tenderness with ROM of jaw noted at TMJ.  Eyes: Conjunctivae are normal. Pupils are equal, round, and reactive to light.  Neck: Neck supple.  Mildly enlarged left anterior cervical lymph node noted.  Cardiovascular: Normal rate, regular rhythm, normal heart sounds and intact distal pulses.   Pulmonary/Chest: Effort normal and breath sounds normal. No respiratory distress. She has no wheezes. She has no rales. She exhibits no tenderness.  Neurological: She is alert and oriented to person, place, and time.  Skin: Skin is warm and dry. No rash noted.  Vitals reviewed.  Assessment/Plan: Cervical lymphadenopathy Left anterior cervical node palpable only just barely so.  Does not feel pathologic.  No signs/symptoms of infection noted. Will check CBC w diff today. Will proceed with further workup based on results and persistence of node   TMJ pain dysfunction syndrome Aleve twice daily. ICE to area. Topical Aspercreme to the area.  Follow-up with ENT.

## 2014-08-16 LAB — CBC WITH DIFFERENTIAL/PLATELET
BASOS PCT: 0.4 % (ref 0.0–3.0)
Basophils Absolute: 0 10*3/uL (ref 0.0–0.1)
EOS PCT: 1.4 % (ref 0.0–5.0)
Eosinophils Absolute: 0.1 10*3/uL (ref 0.0–0.7)
HEMATOCRIT: 38.1 % (ref 36.0–46.0)
HEMOGLOBIN: 12.8 g/dL (ref 12.0–15.0)
Lymphocytes Relative: 38.1 % (ref 12.0–46.0)
Lymphs Abs: 3.1 10*3/uL (ref 0.7–4.0)
MCHC: 33.5 g/dL (ref 30.0–36.0)
MCV: 86.6 fl (ref 78.0–100.0)
MONOS PCT: 4.8 % (ref 3.0–12.0)
Monocytes Absolute: 0.4 10*3/uL (ref 0.1–1.0)
Neutro Abs: 4.6 10*3/uL (ref 1.4–7.7)
Neutrophils Relative %: 55.3 % (ref 43.0–77.0)
Platelets: 227 10*3/uL (ref 150.0–400.0)
RBC: 4.4 Mil/uL (ref 3.87–5.11)
RDW: 14.9 % (ref 11.5–15.5)
WBC: 8.2 10*3/uL (ref 4.0–10.5)

## 2014-11-01 ENCOUNTER — Emergency Department (HOSPITAL_COMMUNITY)
Admission: EM | Admit: 2014-11-01 | Discharge: 2014-11-01 | Disposition: A | Discharge status: Home / Self Care | Payer: Commercial Managed Care - PPO | Attending: Emergency Medicine | Admitting: Emergency Medicine

## 2014-11-01 ENCOUNTER — Encounter (HOSPITAL_COMMUNITY): Payer: Self-pay | Admitting: Emergency Medicine

## 2014-11-01 DIAGNOSIS — Z8619 Personal history of other infectious and parasitic diseases: Secondary | ICD-10-CM | POA: Insufficient documentation

## 2014-11-01 DIAGNOSIS — Z8679 Personal history of other diseases of the circulatory system: Secondary | ICD-10-CM | POA: Insufficient documentation

## 2014-11-01 DIAGNOSIS — Z8719 Personal history of other diseases of the digestive system: Secondary | ICD-10-CM | POA: Insufficient documentation

## 2014-11-01 DIAGNOSIS — R079 Chest pain, unspecified: Secondary | ICD-10-CM | POA: Diagnosis present

## 2014-11-01 DIAGNOSIS — Z87828 Personal history of other (healed) physical injury and trauma: Secondary | ICD-10-CM | POA: Diagnosis not present

## 2014-11-01 DIAGNOSIS — Z8659 Personal history of other mental and behavioral disorders: Secondary | ICD-10-CM | POA: Insufficient documentation

## 2014-11-01 DIAGNOSIS — M940 Chondrocostal junction syndrome [Tietze]: Secondary | ICD-10-CM

## 2014-11-01 DIAGNOSIS — Z8742 Personal history of other diseases of the female genital tract: Secondary | ICD-10-CM | POA: Insufficient documentation

## 2014-11-01 DIAGNOSIS — E663 Overweight: Secondary | ICD-10-CM | POA: Diagnosis not present

## 2014-11-01 LAB — CBC
HEMATOCRIT: 39.9 % (ref 36.0–46.0)
HEMOGLOBIN: 13.3 g/dL (ref 12.0–15.0)
MCH: 29.2 pg (ref 26.0–34.0)
MCHC: 33.3 g/dL (ref 30.0–36.0)
MCV: 87.5 fL (ref 78.0–100.0)
Platelets: 225 10*3/uL (ref 150–400)
RBC: 4.56 MIL/uL (ref 3.87–5.11)
RDW: 13.7 % (ref 11.5–15.5)
WBC: 6.2 10*3/uL (ref 4.0–10.5)

## 2014-11-01 LAB — I-STAT TROPONIN, ED: Troponin i, poc: 0 ng/mL (ref 0.00–0.08)

## 2014-11-01 LAB — BASIC METABOLIC PANEL
Anion gap: 6 (ref 5–15)
BUN: 13 mg/dL (ref 6–20)
CALCIUM: 9.3 mg/dL (ref 8.9–10.3)
CO2: 28 mmol/L (ref 22–32)
Chloride: 105 mmol/L (ref 101–111)
Creatinine, Ser: 0.74 mg/dL (ref 0.44–1.00)
GLUCOSE: 101 mg/dL — AB (ref 65–99)
POTASSIUM: 4.4 mmol/L (ref 3.5–5.1)
SODIUM: 139 mmol/L (ref 135–145)

## 2014-11-01 NOTE — ED Notes (Signed)
Patient complains of chest pain that started on Saturday of last week with radiation to back.  Patient states has had some nausea, but denies vomiting.  Patient states had SOB, but has now resolved.

## 2014-11-01 NOTE — Discharge Instructions (Signed)
Tests were normal. Ibuprofen 800 mg 3 times a day for a short period time. Return if worse Chest Wall Pain Chest wall pain is pain felt in or around the chest bones and muscles. It may take up to 6 weeks to get better. It may take longer if you are active. Chest wall pain can happen on its own. Other times, things like germs, injury, coughing, or exercise can cause the pain. HOME CARE   Avoid activities that make you tired or cause pain. Try not to use your chest, belly (abdominal), or side muscles. Do not use heavy weights.  Put ice on the sore area.  Put ice in a plastic bag.  Place a towel between your skin and the bag.  Leave the ice on for 15-20 minutes for the first 2 days.  Only take medicine as told by your doctor. GET HELP RIGHT AWAY IF:   You have more pain or are very uncomfortable.  You have a fever.  Your chest pain gets worse.  You have new problems.  You feel sick to your stomach (nauseous) or throw up (vomit).  You start to sweat or feel lightheaded.  You have a cough with mucus (phlegm).  You cough up blood. MAKE SURE YOU:   Understand these instructions.  Will watch your condition.  Will get help right away if you are not doing well or get worse. Document Released: 08/20/2007 Document Revised: 05/26/2011 Document Reviewed: 10/28/2010 [facility] Patient Information 2015 [place], [place]. This information is not intended to replace advice given to you by your health care provider. Make sure you discuss any questions you have with your health care provider.

## 2014-11-01 NOTE — ED Notes (Signed)
MD at bedside. 

## 2014-11-01 NOTE — ED Provider Notes (Signed)
CSN: [id number]     Arrival date & time 11/01/14  0818 History   First MD Initiated Contact with Patient 11/01/14 0935     Chief Complaint  Patient presents with  . Chest Pain     (Consider location/radiation/quality/duration/timing/severity/associated sxs/prior Treatment) Patient is a 39 y.o. female presenting with chest pain.  Chest Pain ... Sharp central anterior inferior chest pain for 3 days with radiation to the back.  No dyspnea, diaphoresis, nausea. No chronic health problems. Nonsmoker. No diabetes or hypertension. Father had a MI in his 60s.  Pain is worse with deep breaths and palpation.  Past Medical History  Diagnosis Date  . Palpitations   . SOB (shortness of breath)   . Chest pain   . History of chicken pox   . Menorrhagia 10/24/2012  . Preventative health care 10/24/2012  . Other and unspecified hyperlipidemia 10/24/2012  . Anxiety state, unspecified 10/24/2012  . Neck strain 11/28/2012  . Unspecified constipation 06/01/2013  . FH: diabetes mellitus 06/01/2013  . Low back pain 11/28/2012  . Raynaud phenomenon 06/15/2013  . Overweight(278.02) 08/07/2013   Past Surgical History  Procedure Laterality Date  . Dilitation & currettage/hystroscopy with versapoint resection N/A 10/11/2012    Procedure: DILATATION & CURETTAGE/HYSTEROSCOPY WITH VERSAPOINT RESECTION;  Surgeon: Genia Del, MD;  Location: [facility];  Service: Gynecology;  Laterality: N/A;  endometrial polyp  . Tubal ligation    . Cesarean section      x 3   Family History  Problem Relation Age of Onset  . Heart attack Paternal Grandfather   . Hypertension Father   . Depression Sister   . Heart disease Son     coarctation of aorta repaired, Ross procedure, pulmonary graft, VSD repair   Social History  Substance Use Topics  . Smoking status: Never Smoker   . Smokeless tobacco: Never Used  . Alcohol Use: No   OB History    No data available     Review of Systems  Cardiovascular: Positive for chest  pain.  All other systems reviewed and are negative.     Allergies  Review of patient's allergies indicates no known allergies.  Home Medications   Prior to Admission medications   Not on File   BP 107/61 mmHg  Pulse 73  Temp(Src) 98 F (36.7 C) (Oral)  Resp 15  SpO2 99% Physical Exam  Constitutional: She is oriented to person, place, and time. She appears well-developed and well-nourished.  HENT:  Head: Normocephalic and atraumatic.  Eyes: Conjunctivae and EOM are normal. Pupils are equal, round, and reactive to light.  Neck: Normal range of motion. Neck supple.  Cardiovascular: Normal rate and regular rhythm.   Pulmonary/Chest: Effort normal and breath sounds normal.  Tender to palpation inferior sternum  Abdominal: Soft. Bowel sounds are normal.  Musculoskeletal: Normal range of motion.  Neurological: She is alert and oriented to person, place, and time.  Skin: Skin is warm and dry.  Psychiatric: She has a normal mood and affect. Her behavior is normal.  Nursing note and vitals reviewed.   ED Course  Procedures (including critical care time) Labs Review Labs Reviewed  BASIC METABOLIC PANEL - Abnormal; Notable for the following:    Glucose, Bld 101 (*)    All other components within normal limits  CBC  I-STAT TROPOININ, ED    Imaging Review No results found. I have personally reviewed and evaluated these images and lab results as part of my medical decision-making.   EKG Interpretation  Date/Time:  Wednesday November 01 2014 08:25:18 EDT Ventricular Rate:  95 PR Interval:  162 QRS Duration: 88 QT Interval:  342 QTC Calculation: 429 R Axis:   91 Text Interpretation:  Normal sinus rhythm Rightward axis Borderline ECG No  old tracing to compare Confirmed by MILLER  MD, Kameron Blethen ([id number]) on 11/01/2014  9:30:26 AM Also confirmed by Adriana Simas  MD, Jacqualine Weichel ([id number])  on 11/01/2014  10:46:50 AM      MDM   Final diagnoses:  Costochondritis    Patient is low risk for  acute coronary syndrome or pulmonary embolism. Screening labs and EKG were normal. Vital signs normal. Discharge medications ibuprofen 800 mg 3 times a day    Donnetta Hutching, MD 11/01/14 1050

## 2014-11-27 ENCOUNTER — Ambulatory Visit (INDEPENDENT_AMBULATORY_CARE_PROVIDER_SITE_OTHER): Payer: Commercial Managed Care - PPO | Admitting: Physician Assistant

## 2014-11-27 ENCOUNTER — Encounter: Payer: Self-pay | Admitting: Physician Assistant

## 2014-11-27 VITALS — BP 122/74 | HR 72 | Temp 98.2°F | Resp 16 | Ht 64.0 in | Wt 164.2 lb

## 2014-11-27 DIAGNOSIS — L239 Allergic contact dermatitis, unspecified cause: Secondary | ICD-10-CM | POA: Diagnosis not present

## 2014-11-27 DIAGNOSIS — J069 Acute upper respiratory infection, unspecified: Secondary | ICD-10-CM | POA: Diagnosis not present

## 2014-11-27 DIAGNOSIS — B9789 Other viral agents as the cause of diseases classified elsewhere: Secondary | ICD-10-CM

## 2014-11-27 MED ORDER — TRIAMCINOLONE ACETONIDE 0.5 % EX OINT: 1.0000 "application " | TOPICAL_OINTMENT | Freq: Two times a day (BID) | CUTANEOUS | Status: AC

## 2014-11-27 MED ORDER — METHYLPREDNISOLONE ACETATE 40 MG/ML IJ SUSP
40.0000 mg | Freq: Once | INTRAMUSCULAR | Status: AC
  Administered 2014-11-27: 40 mg via INTRAMUSCULAR

## 2014-11-27 NOTE — Progress Notes (Signed)
Pre visit review using our clinic review tool, if applicable. No additional management support is needed unless otherwise documented below in the visit note/SLS  

## 2014-11-27 NOTE — Patient Instructions (Signed)
Please stay well hydrated and get plenty of rest. Use Delsym as directed (over-the-counter) for cough. Place a humidifier in the bedroom.  For rash, use steroid cream as directed. Continue Benadryl at bedtime. Add on a morning Claritin. Was sheets in hot water only before replacing on bed. Sarna lotion (OTC) and cool compresses may also be beneficial.  Call or return to clinic if symptoms are not resolving.

## 2014-11-27 NOTE — Assessment & Plan Note (Signed)
Rx Kenalo to apply BID. Claritin in AM and Benadryl at bedtime. IM depomedrol given in office. Patient instructed to wash linens in hot water without detergent. Limit pet access to bedroom. Call or return to clinic if symptoms are not resolving.

## 2014-11-27 NOTE — Progress Notes (Signed)
Patient presents to clinic today c/o pruritic rash of upper extremities bilaterally noticed upon waking yesterday. Has now spread to wrists bilaterally. Denies pain or drainage. Denies fever, chills. Denies change to hygiene product or detergent. Does endorse letting her dog in the bedroom. Has not taken anything for symptoms.  Patient also complains of 4 days of dry cough with PND. Denies chest congestion, fever, chills, chest pain. Endorses mild L ear pressure and pain. Denies symptoms effecting right ear. Denies sinus pressure or pain but notes nasal congestion.  Past Medical History  Diagnosis Date  . Palpitations   . SOB (shortness of breath)   . Chest pain   . History of chicken pox   . Menorrhagia 10/24/2012  . Preventative health care 10/24/2012  . Other and unspecified hyperlipidemia 10/24/2012  . Anxiety state, unspecified 10/24/2012  . Neck strain 11/28/2012  . Unspecified constipation 06/01/2013  . FH: diabetes mellitus 06/01/2013  . Low back pain 11/28/2012  . Raynaud phenomenon 06/15/2013  . Overweight(278.02) 08/07/2013    No current outpatient prescriptions on file prior to visit.   No current facility-administered medications on file prior to visit.    No Known Allergies  Family History  Problem Relation Age of Onset  . Heart attack Paternal Grandfather   . Hypertension Father   . Depression Sister   . Heart disease Son     coarctation of aorta repaired, Ross procedure, pulmonary graft, VSD repair    Social History   Social History  . Marital Status: Married    Spouse Name: N/A  . Number of Children: 3  . Years of Education: N/A   Occupational History  . 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 Smoking status: Never Smoker   . Smokeless tobacco: Never Used  . Alcohol Use: No  . Drug Use: No  . Sexual Activity: Yes   Other Topics Concern  . None   Social History Narrative    Review of Systems - See HPI.  All other ROS are  negative.  BP 122/74 mmHg  Pulse 72  Temp(Src) 98.2 F (36.8 C) (Oral)  Resp 16  Ht 5\' 4"  (1.626 m)  Wt 164 lb 4 oz (74.503 kg)  BMI 28.18 kg/m2  SpO2 97%  LMP 11/22/2014  Physical Exam  Constitutional: She is oriented to person, place, and time and well-developed, well-nourished, and in no distress.  HENT:  Head: Normocephalic and atraumatic.  Right Ear: Tympanic membrane and external ear normal.  Left Ear: Tympanic membrane and external ear normal.  Nose: Nose normal.  Mouth/Throat: Uvula is midline and mucous membranes are normal. Posterior oropharyngeal erythema present. No oropharyngeal exudate, posterior oropharyngeal edema or tonsillar abscesses.  Eyes: Conjunctivae are normal.  Neck: Neck supple.  Cardiovascular: Normal rate, regular rhythm, normal heart sounds and intact distal pulses.   Pulmonary/Chest: Effort normal and breath sounds normal. No respiratory distress. She has no wheezes. She has no rales. She exhibits no tenderness.  Neurological: She is alert and oriented to person, place, and time.  Skin: Skin is warm and dry. Rash noted. Rash is papular.  Vitals reviewed.  Recent Results (from the past 2160 hour(s))  Basic metabolic panel     Status: Abnormal   Collection Time: 11/01/14  9:00 AM  Result Value Ref Range   Sodium 139 135 - 145 mmol/L   Potassium 4.4 3.5 - 5.1 mmol/L   Chloride 105 101 - 111 mmol/L   CO2 28  22 - 32 mmol/L   Glucose, Bld 101 (H) 65 - 99 mg/dL   BUN 13 6 - 20 mg/dL   Creatinine, Ser 0.74 0.44 - 1.00 mg/dL   Calcium 9.3 8.9 - 10.3 mg/dL   GFR calc non Af Amer >60 >60 mL/min   GFR calc Af Amer >60 >60 mL/min    Comment: (NOTE) The eGFR has been calculated using the CKD EPI equation. This calculation has not been validated in all clinical situations. eGFR's persistently <60 mL/min signify possible Chronic Kidney Disease.    Anion gap 6 5 - 15  CBC     Status: None   Collection Time: 11/01/14  9:00 AM  Result Value Ref Range    WBC 6.2 4.0 - 10.5 K/uL   RBC 4.56 3.87 - 5.11 MIL/uL   Hemoglobin 13.3 12.0 - 15.0 g/dL   HCT 39.9 36.0 - 46.0 %   MCV 87.5 78.0 - 100.0 fL   MCH 29.2 26.0 - 34.0 pg   MCHC 33.3 30.0 - 36.0 g/dL   RDW 13.7 11.5 - 15.5 %   Platelets 225 150 - 400 K/uL  I-stat troponin, ED     Status: None   Collection Time: 11/01/14  9:04 AM  Result Value Ref Range   Troponin i, poc 0.00 0.00 - 0.08 ng/mL   Comment 3            Comment: Due to the release kinetics of cTnI, a negative result within the first hours of the onset of symptoms does not rule out myocardial infarction with certainty. If myocardial infarction is still suspected, repeat the test at appropriate intervals.    Assessment/Plan: Allergic contact dermatitis Rx Kenalo to apply BID. Claritin in AM and Benadryl at bedtime. IM depomedrol given in office. Patient instructed to wash linens in hot water without detergent. Limit pet access to bedroom. Call or return to clinic if symptoms are not resolving.  Viral URI with cough Rapid strep negative. IM depomedrol given today for pressure. Increase fluids. Rest. Saline nasal spray. Delsym for cough. Allergy medications as directed.

## 2014-11-27 NOTE — Assessment & Plan Note (Signed)
Rapid strep negative. IM depomedrol given today for pressure. Increase fluids. Rest. Saline nasal spray. Delsym for cough. Allergy medications as directed.

## 2014-11-27 NOTE — Addendum Note (Signed)
Addended by: Regis Bill on: 11/27/2014 01:37 PM   Modules accepted: Orders

## 2014-12-26 ENCOUNTER — Telehealth: Payer: Self-pay | Admitting: Family Medicine

## 2014-12-26 ENCOUNTER — Ambulatory Visit: Payer: Commercial Managed Care - PPO | Admitting: Family Medicine

## 2014-12-26 DIAGNOSIS — Z0289 Encounter for other administrative examinations: Secondary | ICD-10-CM

## 2015-01-01 NOTE — Telephone Encounter (Signed)
charge 

## 2015-01-01 NOTE — Telephone Encounter (Signed)
Pt was no show 12/26/14 10:15am for acute appt, pt did not reschedule, charge or no charge?

## 2015-02-23 ENCOUNTER — Telehealth: Payer: Self-pay | Admitting: Family Medicine

## 2015-02-23 NOTE — Telephone Encounter (Signed)
Patient received a VM confirming if flu shot was given, patient returned called received flu shot at [facility] 11/2014

## 2015-02-23 NOTE — Telephone Encounter (Signed)
Documented

## 2015-02-26 ENCOUNTER — Ambulatory Visit (INDEPENDENT_AMBULATORY_CARE_PROVIDER_SITE_OTHER): Payer: Commercial Managed Care - PPO | Admitting: Family Medicine

## 2015-02-26 ENCOUNTER — Encounter: Payer: Self-pay | Admitting: Family Medicine

## 2015-02-26 VITALS — BP 121/76 | HR 89 | Temp 98.0°F | Ht 64.0 in | Wt 169.4 lb

## 2015-02-26 DIAGNOSIS — H8110 Benign paroxysmal vertigo, unspecified ear: Secondary | ICD-10-CM | POA: Diagnosis not present

## 2015-02-26 DIAGNOSIS — E663 Overweight: Secondary | ICD-10-CM | POA: Diagnosis not present

## 2015-02-26 DIAGNOSIS — R109 Unspecified abdominal pain: Secondary | ICD-10-CM | POA: Diagnosis not present

## 2015-02-26 DIAGNOSIS — K5900 Constipation, unspecified: Secondary | ICD-10-CM | POA: Diagnosis not present

## 2015-02-26 LAB — POCT URINALYSIS DIPSTICK
BILIRUBIN UA: NEGATIVE
GLUCOSE UA: NEGATIVE
Ketones, UA: NEGATIVE
Leukocytes, UA: NEGATIVE
Nitrite, UA: NEGATIVE
Protein, UA: NEGATIVE
RBC UA: NEGATIVE
SPEC GRAV UA: 1.02
Urobilinogen, UA: 2
pH, UA: 6

## 2015-02-26 LAB — CBC
HEMATOCRIT: 39.5 % (ref 36.0–46.0)
Hemoglobin: 12.9 g/dL (ref 12.0–15.0)
MCHC: 32.8 g/dL (ref 30.0–36.0)
MCV: 86.1 fl (ref 78.0–100.0)
Platelets: 246 10*3/uL (ref 150.0–400.0)
RBC: 4.59 Mil/uL (ref 3.87–5.11)
RDW: 13.9 % (ref 11.5–15.5)
WBC: 7.9 10*3/uL (ref 4.0–10.5)

## 2015-02-26 LAB — COMPREHENSIVE METABOLIC PANEL
ALBUMIN: 4.1 g/dL (ref 3.5–5.2)
ALK PHOS: 66 U/L (ref 39–117)
ALT: 17 U/L (ref 0–35)
AST: 17 U/L (ref 0–37)
BUN: 12 mg/dL (ref 6–23)
CALCIUM: 9.3 mg/dL (ref 8.4–10.5)
CHLORIDE: 101 meq/L (ref 96–112)
CO2: 30 mEq/L (ref 19–32)
CREATININE: 0.71 mg/dL (ref 0.40–1.20)
GFR: 97.3 mL/min (ref >60.00)
Glucose, Bld: 99 mg/dL (ref 70–99)
POTASSIUM: 3.9 meq/L (ref 3.5–5.1)
Sodium: 137 mEq/L (ref 135–145)
TOTAL PROTEIN: 7.5 g/dL (ref 6.0–8.3)
Total Bilirubin: 0.3 mg/dL (ref 0.2–1.2)

## 2015-02-26 LAB — TSH: TSH: 3.39 u[IU]/mL (ref 0.35–4.50)

## 2015-02-26 NOTE — Progress Notes (Signed)
Pre visit review using our clinic review tool, if applicable. No additional management support is needed unless otherwise documented below in the visit note. 

## 2015-02-26 NOTE — Progress Notes (Signed)
Maria GibneyMelissa Ramos Casey [id number] [birth year] 02/26/2015      Patient Progress Note   Subjective  Chief Complaint  Chief Complaint  Patient presents with  . Flank Pain  . Dizziness    HPI  39 year old female presents with bilateral flank pain and cloudy urine for one week that changed to just be the right side on Saturday. Pain has come and gone but has not increased in severity. Pain is worse at night and has kept her up at night. Urine has been both cloudy and foamy. Dull aching pain most of the day with episodes of sharp pain. Had similar symptoms in 2015. Lasted for about 1-2 weeks at that time.  Denies hematuria. No dysuria or difficulty voiding in general. Dizziness started Saturday. She was fixing a bunk bed and it felt like everything around her was spinning. This also happens sometimes when she gets up quickly. She estimates about 4 times. Goes away after a few seconds once she closes her eyes. Had this happen once before and was told it was due to climate change.  Experiences chronic lower back pain that she sees a chiropractor for but says it has been well controlled lately.  Has not used her thrive and was not able to void for a few days. Was able to this morning for the first time. Denies fever, but felt slightly warm yesterday. Took ibuprofen yesterday but did not help with the pain.  Did not feel well over the weekend and slept most of it but otherwise has not been sick.     Past Medical History  Diagnosis Date  . Palpitations   . SOB (shortness of breath)   . Chest pain   . History of chicken pox   . Menorrhagia 10/24/2012  . Preventative health care 10/24/2012  . Other and unspecified hyperlipidemia 10/24/2012  . Anxiety state, unspecified 10/24/2012  . Neck strain 11/28/2012  . Unspecified constipation 06/01/2013  . FH: diabetes mellitus 06/01/2013  . Low back pain 11/28/2012  . Raynaud phenomenon 06/15/2013  . Overweight(278.02) 08/07/2013    Past Surgical History   Procedure Laterality Date  . Dilitation & currettage/hystroscopy with versapoint resection N/A 10/11/2012    Procedure: DILATATION & CURETTAGE/HYSTEROSCOPY WITH VERSAPOINT RESECTION;  Surgeon: Genia DelMarie-Lyne Lavoie, MD;  Location: [facility];  Service: Gynecology;  Laterality: N/A;  endometrial polyp  . Tubal ligation    . Cesarean section      x 3    Family History  Problem Relation Age of Onset  . Heart attack Paternal Grandfather   . Hypertension Father   . Depression Sister   . Heart disease Son     coarctation of aorta repaired, Ross procedure, pulmonary graft, VSD repair    Social History   Social History  . Marital Status: Married    Spouse Name: N/A  . Number of Children: 3  . Years of Education: N/A   Occupational History  . 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 Smoking status: Never Smoker   . Smokeless tobacco: Never Used  . Alcohol Use: No  . Drug Use: No  . Sexual Activity: Yes   Other Topics Concern  . Not on file   Social History Narrative    Current Outpatient Prescriptions on File Prior to Visit  Medication Sig Dispense Refill  . Multiple Vitamin (MULTIVITAMIN) tablet Take 1 tablet by mouth daily. Thrive Nutritional capsule & shakes    . triamcinolone ointment (KENALOG) 0.5 %  Apply 1 application topically 2 (two) times daily. 30 g 0   No current facility-administered medications on file prior to visit.    No Known Allergies  Review of Systems   Constitutional: Negative for fever and malaise/fatigue.  HENT: Negative for congestion.  Eyes: Negative for discharge.  Respiratory: Negative for shortness of breath.  Cardiovascular: Negative for chest pain, palpitations and leg swelling.  Gastrointestinal: Negative for nausea, abdominal pain and diarrhea.  Genitourinary: Negative for dysuria and urgency, hematuria. Positive for flank pain.  Musculoskeletal: Negative for myalgias and falls.  Skin: Negative for rash.   Neurological: Negative for loss of consciousness. Positive for headaches. .  Endo/Heme/Allergies: Negative for polydipsia.  Psychiatric/Behavioral: Negative for depression and suicidal ideas. The patient is not nervous/anxious and does not have insomnia.   Objective  BP 121/76 mmHg  Pulse 89  Temp(Src) 98 F (36.7 C) (Oral)  Ht  (1.626 m)  Wt 169 lb 6 oz (76.828 kg)  BMI 29.06 kg/m2  SpO2 99%  Physical Exam   Constitutional: Oriented to person, place, and time. Appears well-nourished. No distress.  Eyes: EOM are normal. Pupils are equal, round, and reactive to light.  Cardiovascular: Normal rate and regular rhythm.  Pulmonary/Chest: Breath sounds normal.  Abdominal: Soft. Bowel sounds are normal. Mild right sided flank pain on percussion Lymphadenopathy:   No cervical adenopathy.  Neurological: Alert and oriented to person, place, and time. Normal reflexes. No cranial nerve deficit.    Assessment & Plan  Flank Pain -UA clear -Culture ordered -Most likely MSK in nature, recommend salon pause -Could be from constipation, recommend resume thrive -Will order CBC, CMP to exclude billiary/hepatic cause  Dizziness -most likely vertigo  -continue to monitor, stay hydrated -return if symptoms worsen

## 2015-02-26 NOTE — Patient Instructions (Signed)
Encouraged increased hydration and fiber in diet. Daily probiotics. If bowels not moving can use MOM 2 tbls po in 4 oz of warm prune juice by mouth every 2-3 days. If no results then repeat in 4 hours with  Dulcolax suppository pr, may repeat again in 4 more hours as needed. Seek care if symptoms worsen. Consider daily Miralax and/or Dulcolax if symptoms persist.   Consider Zinc 20 to 50 mg daily for next 2 weeks   Vertigo Vertigo means you feel like you or your surroundings are moving when they are not. Vertigo can be dangerous if it occurs when you are at work, driving, or performing difficult activities.  CAUSES  Vertigo occurs when there is a conflict of signals sent to your brain from the visual and sensory systems in your body. There are many different causes of vertigo, including:  Infections, especially in the inner ear.  A bad reaction to a drug or misuse of alcohol and medicines.  Withdrawal from drugs or alcohol.  Rapidly changing positions, such as lying down or rolling over in bed.  A migraine headache.  Decreased blood flow to the brain.  Increased pressure in the brain from a head injury, infection, tumor, or bleeding. SYMPTOMS  You may feel as though the world is spinning around or you are falling to the ground. Because your balance is upset, vertigo can cause nausea and vomiting. You may have involuntary eye movements (nystagmus). DIAGNOSIS  Vertigo is usually diagnosed by physical exam. If the cause of your vertigo is unknown, your caregiver may perform imaging tests, such as an MRI scan (magnetic resonance imaging). TREATMENT  Most cases of vertigo resolve on their own, without treatment. Depending on the cause, your caregiver may prescribe certain medicines. If your vertigo is related to body position issues, your caregiver may recommend movements or procedures to correct the problem. In rare cases, if your vertigo is caused by certain inner ear problems, you may need  surgery. HOME CARE INSTRUCTIONS   Follow your caregiver's instructions.  Avoid driving.  Avoid operating heavy machinery.  Avoid performing any tasks that would be dangerous to you or others during a vertigo episode.  Tell your caregiver if you notice that certain medicines seem to be causing your vertigo. Some of the medicines used to treat vertigo episodes can actually make them worse in some people. SEEK IMMEDIATE MEDICAL CARE IF:   Your medicines do not relieve your vertigo or are making it worse.  You develop problems with talking, walking, weakness, or using your arms, hands, or legs.  You develop severe headaches.  Your nausea or vomiting continues or gets worse.  You develop visual changes.  A family member notices behavioral changes.  Your condition gets worse. MAKE SURE YOU:  Understand these instructions.  Will watch your condition.  Will get help right away if you are not doing well or get worse.   This information is not intended to replace advice given to you by your health care provider. Make sure you discuss any questions you have with your health care provider.   Document Released: 12/11/2004 Document Revised: 05/26/2011 Document Reviewed: 06/26/2014 Elsevier Interactive Patient Education 2016 Else[employer] Inc.  Interstitial Cystitis Interstitial cystitis is a condition that causes inflammation of the bladder. The bladder is a hollow organ in the lower part of your abdomen. It stores urine after the urine is made by your kidneys. With interstitial cystitis, you may have pain in the bladder area. You may also have  a frequent and urgent need to urinate. The severity of interstitial cystitis can vary from person to person. You may have flare-ups of the condition, and then it may go away for a while. For many people who have this condition, it becomes a long-term problem. CAUSES The cause of this condition is not known. RISK FACTORS This condition is more  likely to develop in women. SYMPTOMS Symptoms of interstitial cystitis vary, and they can change over time. Symptoms may include:  Discomfort or pain in the bladder area. This can range from mild to severe. The pain may change in intensity as the bladder fills with urine or as it empties.  Pelvic pain.  An urgent need to urinate.  Frequent urination.  Pain during sexual intercourse.  Pinpoint bleeding on the bladder wall. For women, the symptoms often get worse during menstruation. DIAGNOSIS This condition is diagnosed by evaluating your symptoms and ruling out other causes. A physical exam will be done. Various tests may be done to rule out other conditions. Common tests include:  Urine tests.  Cystoscopy. In this test, a tool that is like a very thin telescope is used to look into your bladder.  Biopsy. This involves taking a sample of tissue from the bladder wall to be examined under a microscope. TREATMENT There is no cure for interstitial cystitis, but treatment methods are available to control your symptoms. Work closely with your health care provider to find the treatments that will be most effective for you. Treatment options may include:  Medicines to relieve pain and to help reduce the number of times that you feel the need to urinate.  Bladder training. This involves learning ways to control when you urinate, such as:  Urinating at scheduled times.  Training yourself to delay urination.  Doing exercises (Kegel exercises) to strengthen the muscles that control urine flow.  Lifestyle changes, such as changing your diet or taking steps to control stress.  Use of a device that provides electrical stimulation in order to reduce pain.  A procedure that stretches your bladder by filling it with air or fluid.  Surgery. This is rare. It is only done for extreme cases if other treatments do not help. HOME CARE INSTRUCTIONS  Take medicines only as directed by your health  care provider.  Use bladder training techniques as directed.  Keep a bladder diary to find out which foods, liquids, or activities make your symptoms worse.  Use your bladder diary to schedule bathroom trips. If you are away from home, plan to be near a bathroom at each of your scheduled times.  Make sure you urinate just before you leave the house and just before you go to bed.  Do Kegel exercises as directed by your health care provider.  Do not drink alcohol.  Do not use any tobacco products, including cigarettes, chewing tobacco, or electronic cigarettes. If you need help quitting, ask your health care provider.  Make dietary changes as directed by your health care provider. You may need to avoid spicy foods and foods that contain a high amount of potassium.  Limit your drinking of beverages that stimulate urination. These include soda, coffee, and tea.  Keep all follow-up visits as directed by your health care provider. This is important. SEEK MEDICAL CARE IF:  Your symptoms do not get better after treatment.  Your pain and discomfort are getting worse.  You have more frequent urges to urinate.  You have a fever. SEEK IMMEDIATE MEDICAL CARE IF:  You are not able to control your bladder at all.   This information is not intended to replace advice given to you by your health care provider. Make sure you discuss any questions you have with your health care provider.   Document Released: 11/02/2003 Document Revised: 03/24/2014 Document Reviewed: 11/08/2013 Elsevier Interactive Patient Education 2016 [employer].    Constipation, Adult Constipation is when a person has fewer than three bowel movements a week, has difficulty having a bowel movement, or has stools that are dry, hard, or larger than normal. As people grow older, constipation is more common. A low-fiber diet, not taking in enough fluids, and taking certain medicines may make constipation worse.  CAUSES    Certain medicines, such as antidepressants, pain medicine, iron supplements, antacids, and water pills.   Certain diseases, such as diabetes, irritable bowel syndrome (IBS), thyroid disease, or depression.   Not drinking enough water.   Not eating enough fiber-rich foods.   Stress or travel.   Lack of physical activity or exercise.   Ignoring the urge to have a bowel movement.   Using laxatives too much.  SIGNS AND SYMPTOMS   Having fewer than three bowel movements a week.   Straining to have a bowel movement.   Having stools that are hard, dry, or larger than normal.   Feeling full or bloated.   Pain in the lower abdomen.   Not feeling relief after having a bowel movement.  DIAGNOSIS  Your health care provider will take a medical history and perform a physical exam. Further testing may be done for severe constipation. Some tests may include:  A barium enema X-ray to examine your rectum, colon, and, sometimes, your small intestine.   A sigmoidoscopy to examine your lower colon.   A colonoscopy to examine your entire colon. TREATMENT  Treatment will depend on the severity of your constipation and what is causing it. Some dietary treatments include drinking more fluids and eating more fiber-rich foods. Lifestyle treatments may include regular exercise. If these diet and lifestyle recommendations do not help, your health care provider may recommend taking over-the-counter laxative medicines to help you have bowel movements. Prescription medicines may be prescribed if over-the-counter medicines do not work.  HOME CARE INSTRUCTIONS   Eat foods that have a lot of fiber, such as fruits, vegetables, whole grains, and beans.  Limit foods high in fat and processed sugars, such as french fries, hamburgers, cookies, candies, and soda.   A fiber supplement may be added to your diet if you cannot get enough fiber from foods.   Drink enough fluids to keep your  urine clear or pale yellow.   Exercise regularly or as directed by your health care provider.   Go to the restroom when you have the urge to go. Do not hold it.   Only take over-the-counter or prescription medicines as directed by your health care provider. Do not take other medicines for constipation without talking to your health care provider first.  SEEK IMMEDIATE MEDICAL CARE IF:   You have bright red blood in your stool.   Your constipation lasts for more than 4 days or gets worse.   You have abdominal or rectal pain.   You have thin, pencil-like stools.   You have unexplained weight loss. MAKE SURE YOU:   Understand these instructions.  Will watch your condition.  Will get help right away if you are not doing well or get worse.   This information is not intended  to replace advice given to you by your health care provider. Make sure you discuss any questions you have with your health care provider.   Document Released: 11/30/2003 Document Revised: 03/24/2014 Document Reviewed: 12/13/2012 Elsevier Interactive Patient Education [employer]2016 Elsevier Inc.

## 2015-02-28 LAB — CULTURE, URINE COMPREHENSIVE
COLONY COUNT: NO GROWTH
ORGANISM ID, BACTERIA: NO GROWTH

## 2015-03-03 ENCOUNTER — Encounter: Payer: Self-pay | Admitting: Family Medicine

## 2015-03-03 DIAGNOSIS — H8110 Benign paroxysmal vertigo, unspecified ear: Secondary | ICD-10-CM | POA: Insufficient documentation

## 2015-03-03 NOTE — Assessment & Plan Note (Signed)
Encouraged increased hydration. Rest and report if symptoms return

## 2015-03-03 NOTE — Assessment & Plan Note (Signed)
Encouraged DASH diet, decrease po intake and increase exercise as tolerated. Needs 7-8 hours of sleep nightly. Avoid trans fats, eat small, frequent meals every 4-5 hours with lean proteins, complex carbs and healthy fats. Minimize simple carbs. Is going to restart her Thrive supplement

## 2015-03-03 NOTE — Assessment & Plan Note (Signed)
Was controlled on her thrive supplement but she has been out and noting smaller, harder BMs since then likely constipation is contributing to flank pain. Encouraged increased hydration and fiber in diet. Daily probiotics. If bowels not moving can use MOM 2 tbls po in 4 oz of warm prune juice by mouth every 2-3 days.

## 2015-03-03 NOTE — Progress Notes (Signed)
Subjective:    Patient ID: Maria Casey, female    DOB: [birth year], 39 y.o.   MRN: [id number]  Chief Complaint  Patient presents with  . Flank Pain  . Dizziness    HPI Patient is in today for evaluation of numerous concerns. She has a distant history of vertigo and over this past week she has had several episodes of moving quickly and feeling vertigo. She denies any nausea vomiting. She denies any syncope or headache. No other associated neurologic symptoms and today her symptoms are nearly resolved. She has been experiencing low-grade constipation since running out of her dietary supplement called Thrive. Small hard bowel movements have been noted this week. No bloody or tarry stool. Finally she notes some flank pain. It is worse with position changes and when she lies down at night. She has not had any colicky nature to the pain. No fevers or chills. Her urine has been somewhat cloudy but no dysuria frequency or urgency. Denies CP/palp/SOB/HA/feversTaking meds as prescribed  Past Medical History  Diagnosis Date  . Palpitations   . SOB (shortness of breath)   . Chest pain   . History of chicken pox   . Menorrhagia 10/24/2012  . Preventative health care 10/24/2012  . Other and unspecified hyperlipidemia 10/24/2012  . Anxiety state, unspecified 10/24/2012  . Neck strain 11/28/2012  . Unspecified constipation 06/01/2013  . FH: diabetes mellitus 06/01/2013  . Low back pain 11/28/2012  . Raynaud phenomenon 06/15/2013  . Overweight(278.02) 08/07/2013    Past Surgical History  Procedure Laterality Date  . Dilitation & currettage/hystroscopy with versapoint resection N/A 10/11/2012    Procedure: DILATATION & CURETTAGE/HYSTEROSCOPY WITH VERSAPOINT RESECTION;  Surgeon: Genia Del, MD;  Location: [facility];  Service: Gynecology;  Laterality: N/A;  endometrial polyp  . Tubal ligation    . Cesarean section      x 3    Family History  Problem Relation Age of Onset  . Heart attack  Paternal Grandfather   . Hypertension Father   . Depression Sister   . Heart disease Son     coarctation of aorta repaired, Ross procedure, pulmonary graft, VSD repair    Social History   Social History  . Marital Status: Married    Spouse Name: N/A  . Number of Children: 3  . Years of Education: N/A   Occupational History  . NURSING SECRETARY  [employer]   Social History Main Topics  . Smoking status: Never Smoker   . Smokeless tobacco: Never Used  . Alcohol Use: No  . Drug Use: No  . Sexual Activity: Yes   Other Topics Concern  . Not on file   Social History Narrative    Outpatient Prescriptions Prior to Visit  Medication Sig Dispense Refill  . Multiple Vitamin (MULTIVITAMIN) tablet Take 1 tablet by mouth daily. Thrive Nutritional capsule & shakes    . triamcinolone ointment (KENALOG) 0.5 % Apply 1 application topically 2 (two) times daily. 30 g 0   No facility-administered medications prior to visit.    No Known Allergies  Review of Systems  Constitutional: Negative for fever and malaise/fatigue.  HENT: Positive for congestion.   Eyes: Negative for discharge.  Respiratory: Negative for shortness of breath.   Cardiovascular: Negative for chest pain, palpitations and leg swelling.  Gastrointestinal: Negative for nausea and abdominal pain.  Genitourinary: Positive for flank pain. Negative for dysuria, urgency and hematuria.  Musculoskeletal: Positive for back pain. Negative for falls.  Skin: Negative for  rash.  Neurological: Positive for dizziness. Negative for tingling, focal weakness, loss of consciousness and headaches.  Endo/Heme/Allergies: Negative for environmental allergies.  Psychiatric/Behavioral: Negative for depression. The patient is not nervous/anxious.        Objective:    Physical Exam  Constitutional: She is oriented to person, place, and time. She appears well-developed and well-nourished. No distress.  HENT:  Head: Normocephalic and  atraumatic.  Nose: Nose normal.  Nasal mucosa boggy and erythematous. TMs dull and retracted. 1 beat of nystagmus  Eyes: Right eye exhibits no discharge. Left eye exhibits no discharge.  Neck: Normal range of motion. Neck supple.  Cardiovascular: Normal rate and regular rhythm.   No murmur heard. Pulmonary/Chest: Effort normal and breath sounds normal.  Abdominal: Soft. Bowel sounds are normal. There is no tenderness.  Musculoskeletal: She exhibits no edema.  Neurological: She is alert and oriented to person, place, and time.  Skin: Skin is warm and dry.  Psychiatric: She has a normal mood and affect.  Nursing note and vitals reviewed.   BP 121/76 mmHg  Pulse 89  Temp(Src) 98 F (36.7 C) (Oral)  Ht 5\' 4"  (1.626 m)  Wt 169 lb 6 oz (76.828 kg)  BMI 29.06 kg/m2  SpO2 99% Wt Readings from Last 3 Encounters:  02/26/15 169 lb 6 oz (76.828 kg)  11/27/14 164 lb 4 oz (74.503 kg)  08/15/14 166 lb 3.2 oz (75.388 kg)     Lab Results  Component Value Date   WBC 7.9 02/26/2015   HGB 12.9 02/26/2015   HCT 39.5 02/26/2015   PLT 246.0 02/26/2015   GLUCOSE 99 02/26/2015   CHOL 138 11/28/2013   TRIG 49.0 11/28/2013   HDL 41.70 11/28/2013   LDLCALC 87 11/28/2013   ALT 17 02/26/2015   AST 17 02/26/2015   NA 137 02/26/2015   K 3.9 02/26/2015   CL 101 02/26/2015   CREATININE 0.71 02/26/2015   BUN 12 02/26/2015   CO2 30 02/26/2015   TSH 3.39 02/26/2015   HGBA1C 5.7 05/19/2013    Lab Results  Component Value Date   TSH 3.39 02/26/2015   Lab Results  Component Value Date   WBC 7.9 02/26/2015   HGB 12.9 02/26/2015   HCT 39.5 02/26/2015   MCV 86.1 02/26/2015   PLT 246.0 02/26/2015   Lab Results  Component Value Date   NA 137 02/26/2015   K 3.9 02/26/2015   CO2 30 02/26/2015   GLUCOSE 99 02/26/2015   BUN 12 02/26/2015   CREATININE 0.71 02/26/2015   BILITOT 0.3 02/26/2015   ALKPHOS 66 02/26/2015   AST 17 02/26/2015   ALT 17 02/26/2015   PROT 7.5 02/26/2015   ALBUMIN  4.1 02/26/2015   CALCIUM 9.3 02/26/2015   ANIONGAP 6 11/01/2014   GFR 97.30 02/26/2015   Lab Results  Component Value Date   CHOL 138 11/28/2013   Lab Results  Component Value Date   HDL 41.70 11/28/2013   Lab Results  Component Value Date   LDLCALC 87 11/28/2013   Lab Results  Component Value Date   TRIG 49.0 11/28/2013   Lab Results  Component Value Date   CHOLHDL 3 11/28/2013   Lab Results  Component Value Date   HGBA1C 5.7 05/19/2013       Assessment & Plan:   Problem List Items Addressed This Visit    Benign paroxysmal positional vertigo    Encouraged increased hydration. Rest and report if symptoms return      Constipation  Was controlled on her thrive supplement but she has been out and noting smaller, harder BMs since then likely constipation is contributing to flank pain. Encouraged increased hydration and fiber in diet. Daily probiotics. If bowels not moving can use MOM 2 tbls po in 4 oz of warm prune juice by mouth every 2-3 days.       Overweight    Encouraged DASH diet, decrease po intake and increase exercise as tolerated. Needs 7-8 hours of sleep nightly. Avoid trans fats, eat small, frequent meals every 4-5 hours with lean proteins, complex carbs and healthy fats. Minimize simple carbs. Is going to restart her Thrive supplement       Other Visit Diagnoses    Flank pain    -  Primary    presentation c/w musculoskeletal cause. encouraged moist heat, activity as tolerated and topical treatments such as Salon Pas. report if no imrprovement. lab wo    Relevant Orders    CULTURE, URINE COMPREHENSIVE (Completed)    POCT Urinalysis Dipstick (Completed)    Comprehensive metabolic panel (Completed)    CBC (Completed)    TSH (Completed)       I am having Ms. Dorian Heckle maintain her multivitamin and triamcinolone ointment.  No orders of the defined types were placed in this encounter.     Danise Edge, MD

## 2015-08-20 IMAGING — US US SOFT TISSUE HEAD/NECK
1 series · 14 of 16 positions shown · non-contrast
Comparison: 05/20/2005

CLINICAL DATA: neck pain, left side swelling, sore throat

EXAM:
THYROID ULTRASOUND
TECHNIQUE: Ultrasound examination of the thyroid gland and adjacent soft
tissues was performed.

[Series 1: us soft tissue head/neck · 0.08mm/px · 14 of 16 slices shown]
[im 1/16]
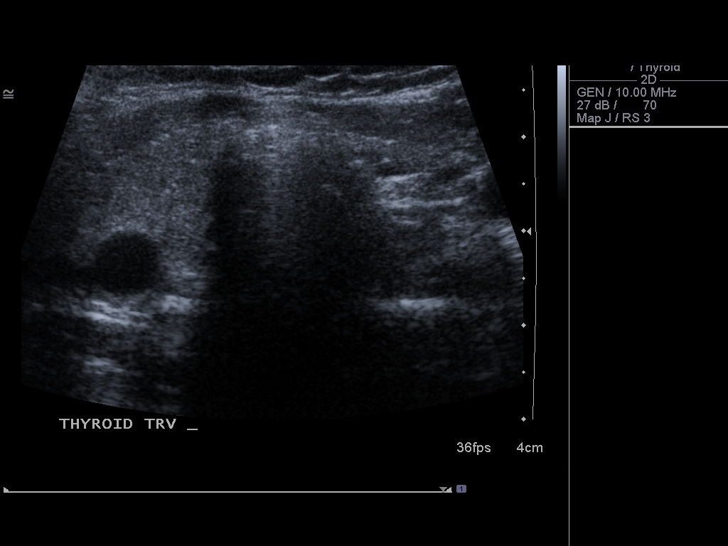
[im 2/16]
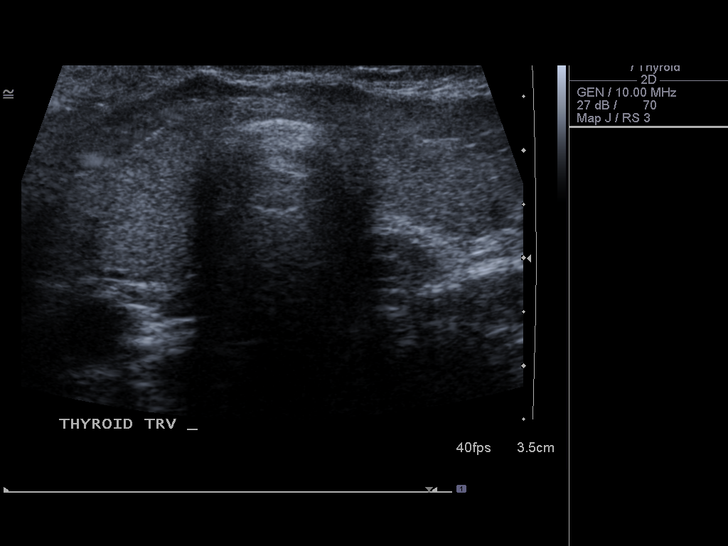
[im 3/16]
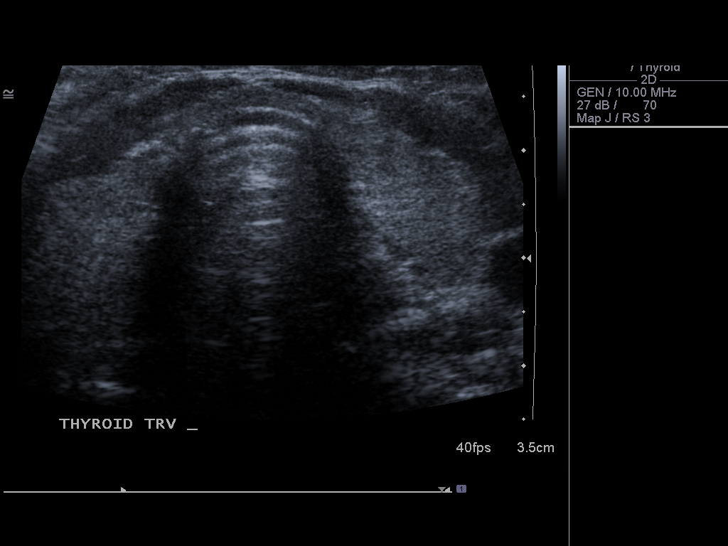
[im 5/16]
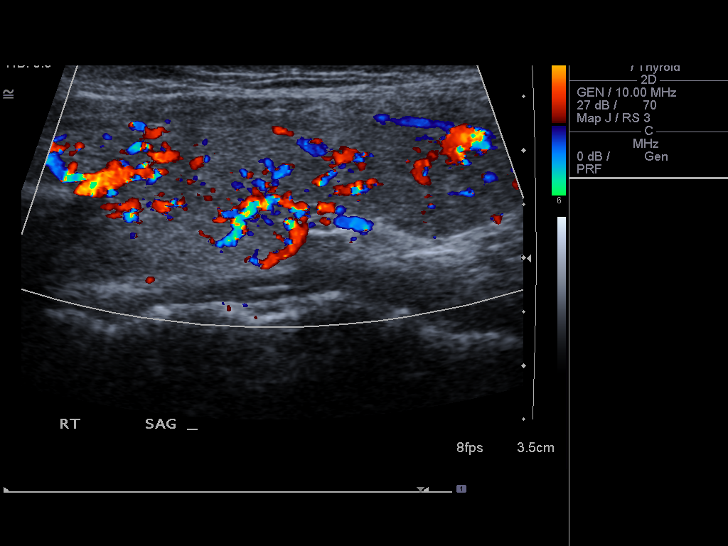
[im 6/16]
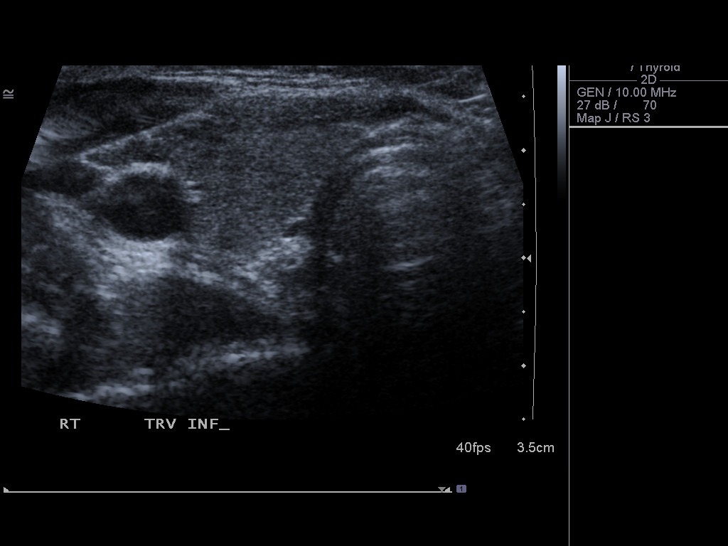
[im 7/16]
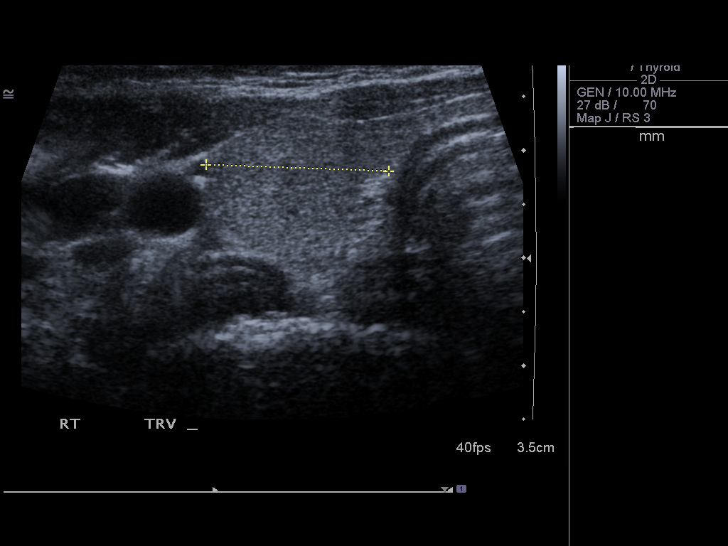
[im 8/16]
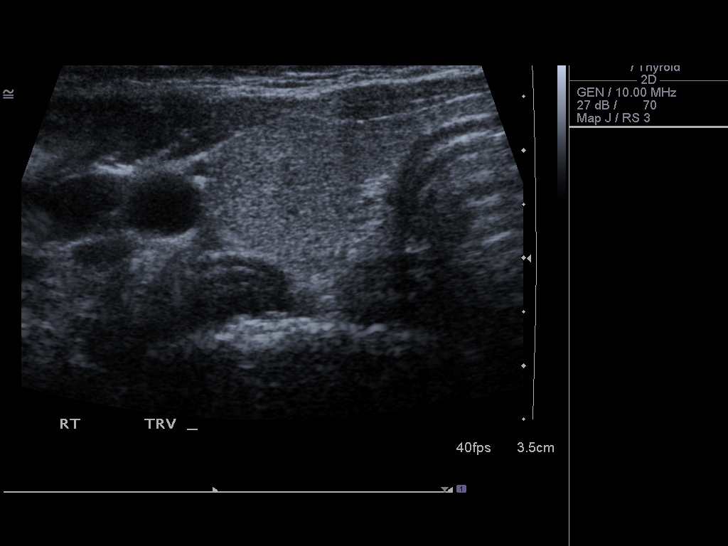
[im 9/16]
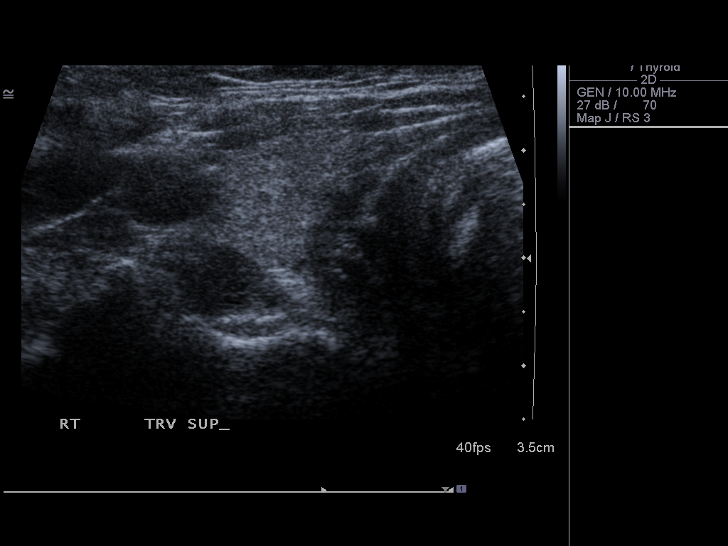
[im 10/16]
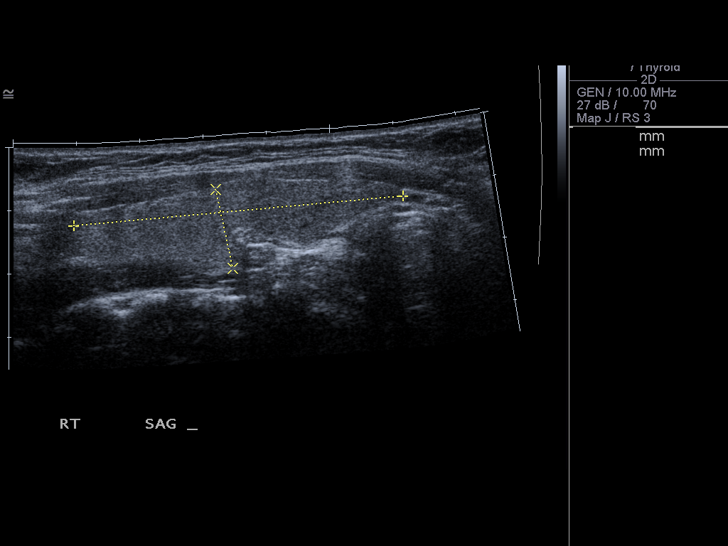
[im 11/16]
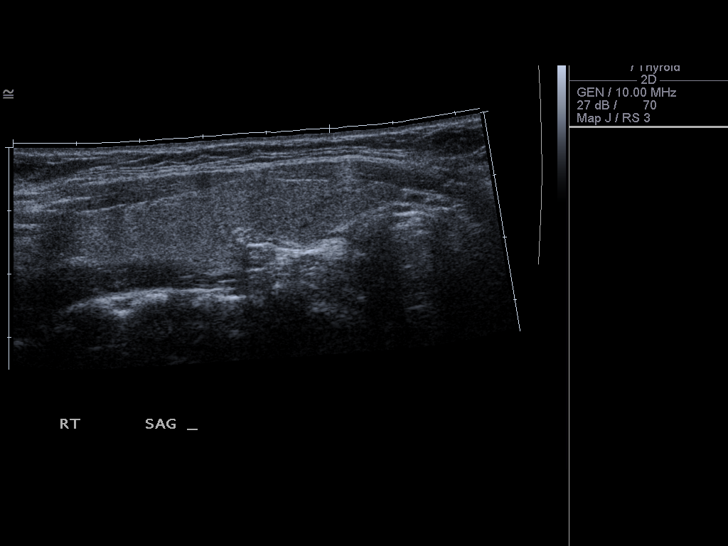
[im 13/16]
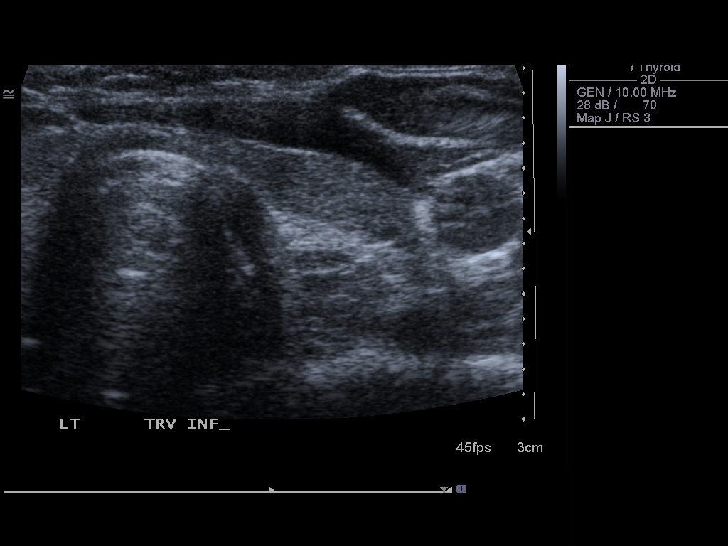
[im 14/16]
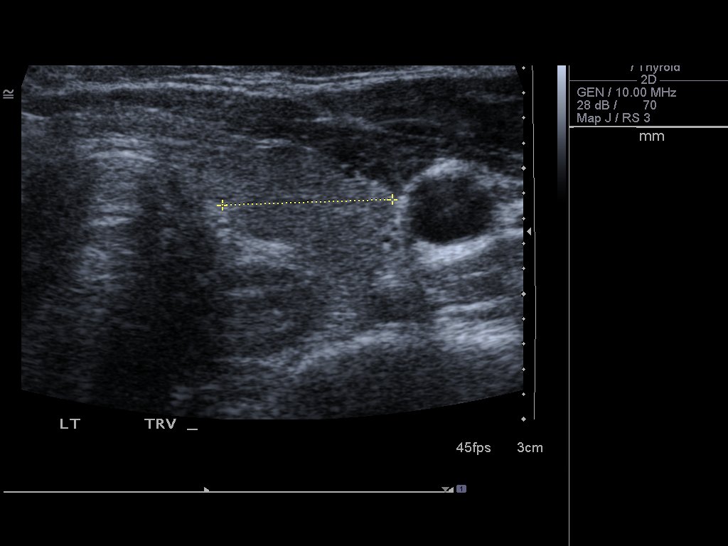
[im 15/16]
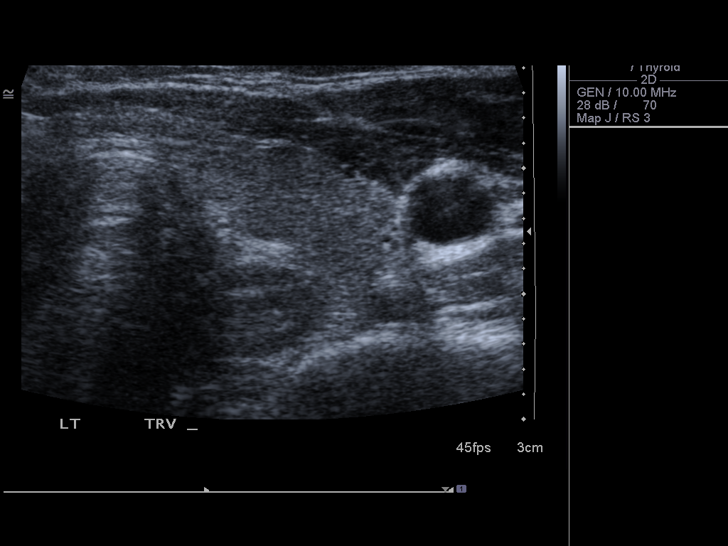
[im 16/16]
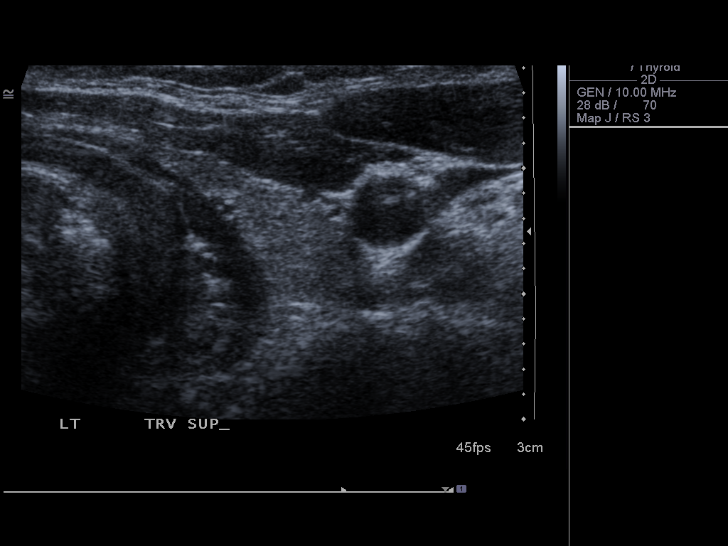

[14 of 16 positions shown; findings below may reference images not displayed]

FINDINGS: Right thyroid lobe

Measurements: 52 x 13 x 17 mm.  No nodules visualized.

Left thyroid lobe

Measurements: 40 x 10 x 14 mm.  No nodules visualized.

Isthmus

Thickness: 2 mm.  No nodules visualized.

Lymphadenopathy

None visualized.

Technologist states that no additional neck lesions were identified
including the region of the previously identified thyroglossal duct
cyst, although imaging superior to the thyroid was not documented.
IMPRESSION: Unremarkable thyroid without nodule or other focal lesion.

## 2015-08-28 ENCOUNTER — Encounter: Payer: Self-pay | Admitting: Family Medicine

## 2015-08-28 ENCOUNTER — Ambulatory Visit (INDEPENDENT_AMBULATORY_CARE_PROVIDER_SITE_OTHER): Payer: Commercial Managed Care - PPO | Admitting: Family Medicine

## 2015-08-28 VITALS — BP 108/72 | HR 88 | Temp 98.5°F | Ht 64.0 in | Wt 169.4 lb

## 2015-08-28 DIAGNOSIS — Z578 Occupational exposure to other risk factors: Secondary | ICD-10-CM

## 2015-08-28 DIAGNOSIS — F329 Major depressive disorder, single episode, unspecified: Secondary | ICD-10-CM

## 2015-08-28 DIAGNOSIS — Z0000 Encounter for general adult medical examination without abnormal findings: Secondary | ICD-10-CM

## 2015-08-28 DIAGNOSIS — Z9189 Other specified personal risk factors, not elsewhere classified: Secondary | ICD-10-CM

## 2015-08-28 DIAGNOSIS — F419 Anxiety disorder, unspecified: Principal | ICD-10-CM

## 2015-08-28 DIAGNOSIS — K5900 Constipation, unspecified: Secondary | ICD-10-CM | POA: Diagnosis not present

## 2015-08-28 DIAGNOSIS — F411 Generalized anxiety disorder: Secondary | ICD-10-CM

## 2015-08-28 DIAGNOSIS — F32A Depression, unspecified: Secondary | ICD-10-CM

## 2015-08-28 DIAGNOSIS — F418 Other specified anxiety disorders: Secondary | ICD-10-CM

## 2015-08-28 DIAGNOSIS — R002 Palpitations: Secondary | ICD-10-CM

## 2015-08-28 DIAGNOSIS — R61 Generalized hyperhidrosis: Secondary | ICD-10-CM | POA: Diagnosis not present

## 2015-08-28 LAB — HEPATITIS C ANTIBODY: HCV Ab: NEGATIVE

## 2015-08-28 MED ORDER — ESCITALOPRAM OXALATE 10 MG PO TABS: 10.0000 mg | ORAL_TABLET | Freq: Every day | ORAL | Status: AC

## 2015-08-28 MED ORDER — ALPRAZOLAM 0.25 MG PO TABS: 0.2500 mg | ORAL_TABLET | Freq: Two times a day (BID) | ORAL | Status: AC | PRN

## 2015-08-28 NOTE — Progress Notes (Signed)
Pre visit review using our clinic review tool, if applicable. No additional management support is needed unless otherwise documented below in the visit note. 

## 2015-08-28 NOTE — Patient Instructions (Signed)
Zyrtec 10 mg and Zantac 150 mg twice daily  Preventive Care for Adults, Female A healthy lifestyle and preventive care can promote health and wellness. Preventive health guidelines for women include the following key practices.  A routine yearly physical is a good way to check with your health care provider about your health and preventive screening. It is a chance to share any concerns and updates on your health and to receive a thorough exam.  Visit your dentist for a routine exam and preventive care every 6 months. Brush your teeth twice a day and floss once a day. Good oral hygiene prevents tooth decay and gum disease.  The frequency of eye exams is based on your age, health, family medical history, use of contact lenses, and other factors. Follow your health care provider's recommendations for frequency of eye exams.  Eat a healthy diet. Foods like vegetables, fruits, whole grains, low-fat dairy products, and lean protein foods contain the nutrients you need without too many calories. Decrease your intake of foods high in solid fats, added sugars, and salt. Eat the right amount of calories for you.Get information about a proper diet from your health care provider, if necessary.  Regular physical exercise is one of the most important things you can do for your health. Most adults should get at least 150 minutes of moderate-intensity exercise (any activity that increases your heart rate and causes you to sweat) each week. In addition, most adults need muscle-strengthening exercises on 2 or more days a week.  Maintain a healthy weight. The body mass index (BMI) is a screening tool to identify possible weight problems. It provides an estimate of body fat based on height and weight. Your health care provider can find your BMI and can help you achieve or maintain a healthy weight.For adults 20 years and older:  A BMI below 18.5 is considered underweight.  A BMI of 18.5 to 24.9 is normal.  A BMI  of 25 to 29.9 is considered overweight.  A BMI of 30 and above is considered obese.  Maintain normal blood lipids and cholesterol levels by exercising and minimizing your intake of saturated fat. Eat a balanced diet with plenty of fruit and vegetables. Blood tests for lipids and cholesterol should begin at age 46 and be repeated every 5 years. If your lipid or cholesterol levels are high, you are over 50, or you are at high risk for heart disease, you may need your cholesterol levels checked more frequently.Ongoing high lipid and cholesterol levels should be treated with medicines if diet and exercise are not working.  If you smoke, find out from your health care provider how to quit. If you do not use tobacco, do not start.  Lung cancer screening is recommended for adults aged 39-80 years who are at high risk for developing lung cancer because of a history of smoking. A yearly low-dose CT scan of the lungs is recommended for people who have at least a 30-pack-year history of smoking and are a current smoker or have quit within the past 15 years. A pack year of smoking is smoking an average of 1 pack of cigarettes a day for 1 year (for example: 1 pack a day for 30 years or 2 packs a day for 15 years). Yearly screening should continue until the smoker has stopped smoking for at least 15 years. Yearly screening should be stopped for people who develop a health problem that would prevent them from having lung cancer treatment.  If  you are pregnant, do not drink alcohol. If you are breastfeeding, be very cautious about drinking alcohol. If you are not pregnant and choose to drink alcohol, do not have more than 1 drink per day. One drink is considered to be 12 ounces (355 mL) of beer, 5 ounces (148 mL) of wine, or 1.5 ounces (44 mL) of liquor.  Avoid use of street drugs. Do not share needles with anyone. Ask for help if you need support or instructions about stopping the use of drugs.  High blood pressure  causes heart disease and increases the risk of stroke. Your blood pressure should be checked at least every 1 to 2 years. Ongoing high blood pressure should be treated with medicines if weight loss and exercise do not work.  If you are 53-42 years old, ask your health care provider if you should take aspirin to prevent strokes.  Diabetes screening is done by taking a blood sample to check your blood glucose level after you have not eaten for a certain period of time (fasting). If you are not overweight and you do not have risk factors for diabetes, you should be screened once every 3 years starting at age 34. If you are overweight or obese and you are 59-77 years of age, you should be screened for diabetes every year as part of your cardiovascular risk assessment.  Breast cancer screening is essential preventive care for women. You should practice "breast self-awareness." This means understanding the normal appearance and feel of your breasts and may include breast self-examination. Any changes detected, no matter how small, should be reported to a health care provider. Women in their 65s and 30s should have a clinical breast exam (CBE) by a health care provider as part of a regular health exam every 1 to 3 years. After age 40, women should have a CBE every year. Starting at age 55, women should consider having a mammogram (breast X-ray test) every year. Women who have a family history of breast cancer should talk to their health care provider about genetic screening. Women at a high risk of breast cancer should talk to their health care providers about having an MRI and a mammogram every year.  Breast cancer gene (BRCA)-related cancer risk assessment is recommended for women who have family members with BRCA-related cancers. BRCA-related cancers include breast, ovarian, tubal, and peritoneal cancers. Having family members with these cancers may be associated with an increased risk for harmful changes  (mutations) in the breast cancer genes BRCA1 and BRCA2. Results of the assessment will determine the need for genetic counseling and BRCA1 and BRCA2 testing.  Your health care provider may recommend that you be screened regularly for cancer of the pelvic organs (ovaries, uterus, and vagina). This screening involves a pelvic examination, including checking for microscopic changes to the surface of your cervix (Pap test). You may be encouraged to have this screening done every 3 years, beginning at age 1.  For women ages 17-65, health care providers may recommend pelvic exams and Pap testing every 3 years, or they may recommend the Pap and pelvic exam, combined with testing for human papilloma virus (HPV), every 5 years. Some types of HPV increase your risk of cervical cancer. Testing for HPV may also be done on women of any age with unclear Pap test results.  Other health care providers may not recommend any screening for nonpregnant women who are considered low risk for pelvic cancer and who do not have symptoms. Ask your health  care provider if a screening pelvic exam is right for you.  If you have had past treatment for cervical cancer or a condition that could lead to cancer, you need Pap tests and screening for cancer for at least 20 years after your treatment. If Pap tests have been discontinued, your risk factors (such as having a new sexual partner) need to be reassessed to determine if screening should resume. Some women have medical problems that increase the chance of getting cervical cancer. In these cases, your health care provider may recommend more frequent screening and Pap tests.  Colorectal cancer can be detected and often prevented. Most routine colorectal cancer screening begins at the age of 35 years and continues through age 63 years. However, your health care provider may recommend screening at an earlier age if you have risk factors for colon cancer. On a yearly basis, your health  care provider may provide home test kits to check for hidden blood in the stool. Use of a small camera at the end of a tube, to directly examine the colon (sigmoidoscopy or colonoscopy), can detect the earliest forms of colorectal cancer. Talk to your health care provider about this at age 62, when routine screening begins. Direct exam of the colon should be repeated every 5-10 years through age 83 years, unless early forms of precancerous polyps or small growths are found.  People who are at an increased risk for hepatitis B should be screened for this virus. You are considered at high risk for hepatitis B if:  You were born in a country where hepatitis B occurs often. Talk with your health care provider about which countries are considered high risk.  Your parents were born in a high-risk country and you have not received a shot to protect against hepatitis B (hepatitis B vaccine).  You have HIV or AIDS.  You use needles to inject street drugs.  You live with, or have sex with, someone who has hepatitis B.  You get hemodialysis treatment.  You take certain medicines for conditions like cancer, organ transplantation, and autoimmune conditions.  Hepatitis C blood testing is recommended for all people born from [birth year] through 1965 and any individual with known risks for hepatitis C.  Practice safe sex. Use condoms and avoid high-risk sexual practices to reduce the spread of sexually transmitted infections (STIs). STIs include gonorrhea, chlamydia, syphilis, trichomonas, herpes, HPV, and human immunodeficiency virus (HIV). Herpes, HIV, and HPV are viral illnesses that have no cure. They can result in disability, cancer, and death.  You should be screened for sexually transmitted illnesses (STIs) including gonorrhea and chlamydia if:  You are sexually active and are younger than 24 years.  You are older than 24 years and your health care provider tells you that you are at risk for this type of  infection.  Your sexual activity has changed since you were last screened and you are at an increased risk for chlamydia or gonorrhea. Ask your health care provider if you are at risk.  If you are at risk of being infected with HIV, it is recommended that you take a prescription medicine daily to prevent HIV infection. This is called preexposure prophylaxis (PrEP). You are considered at risk if:  You are sexually active and do not regularly use condoms or know the HIV status of your partner(s).  You take drugs by injection.  You are sexually active with a partner who has HIV.  Talk with your health care provider about whether you  are at high risk of being infected with HIV. If you choose to begin PrEP, you should first be tested for HIV. You should then be tested every 3 months for as long as you are taking PrEP.  Osteoporosis is a disease in which the bones lose minerals and strength with aging. This can result in serious bone fractures or breaks. The risk of osteoporosis can be identified using a bone density scan. Women ages 54 years and over and women at risk for fractures or osteoporosis should discuss screening with their health care providers. Ask your health care provider whether you should take a calcium supplement or vitamin D to reduce the rate of osteoporosis.  Menopause can be associated with physical symptoms and risks. Hormone replacement therapy is available to decrease symptoms and risks. You should talk to your health care provider about whether hormone replacement therapy is right for you.  Use sunscreen. Apply sunscreen liberally and repeatedly throughout the day. You should seek shade when your shadow is shorter than you. Protect yourself by wearing long sleeves, pants, a wide-brimmed hat, and sunglasses year round, whenever you are outdoors.  Once a month, do a whole body skin exam, using a mirror to look at the skin on your back. Tell your health care provider of new moles,  moles that have irregular borders, moles that are larger than a pencil eraser, or moles that have changed in shape or color.  Stay current with required vaccines (immunizations).  Influenza vaccine. All adults should be immunized every year.  Tetanus, diphtheria, and acellular pertussis (Td, Tdap) vaccine. Pregnant women should receive 1 dose of Tdap vaccine during each pregnancy. The dose should be obtained regardless of the length of time since the last dose. Immunization is preferred during the 27th-36th week of gestation. An adult who has not previously received Tdap or who does not know her vaccine status should receive 1 dose of Tdap. This initial dose should be followed by tetanus and diphtheria toxoids (Td) booster doses every 10 years. Adults with an unknown or incomplete history of completing a 3-dose immunization series with Td-containing vaccines should begin or complete a primary immunization series including a Tdap dose. Adults should receive a Td booster every 10 years.  Varicella vaccine. An adult without evidence of immunity to varicella should receive 2 doses or a second dose if she has previously received 1 dose. Pregnant females who do not have evidence of immunity should receive the first dose after pregnancy. This first dose should be obtained before leaving the health care facility. The second dose should be obtained 4-8 weeks after the first dose.  Human papillomavirus (HPV) vaccine. Females aged 13-26 years who have not received the vaccine previously should obtain the 3-dose series. The vaccine is not recommended for use in pregnant females. However, pregnancy testing is not needed before receiving a dose. If a female is found to be pregnant after receiving a dose, no treatment is needed. In that case, the remaining doses should be delayed until after the pregnancy. Immunization is recommended for any person with an immunocompromised condition through the age of 74 years if she  did not get any or all doses earlier. During the 3-dose series, the second dose should be obtained 4-8 weeks after the first dose. The third dose should be obtained 24 weeks after the first dose and 16 weeks after the second dose.  Zoster vaccine. One dose is recommended for adults aged 59 years or older unless certain conditions are  present.  Measles, mumps, and rubella (MMR) vaccine. Adults born before [birth year] generally are considered immune to measles and mumps. Adults born in [birth year] or later should have 1 or more doses of MMR vaccine unless there is a contraindication to the vaccine or there is laboratory evidence of immunity to each of the three diseases. A routine second dose of MMR vaccine should be obtained at least 28 days after the first dose for students attending postsecondary schools, health care workers, or international travelers. People who received inactivated measles vaccine or an unknown type of measles vaccine during 1963-1967 should receive 2 doses of MMR vaccine. People who received inactivated mumps vaccine or an unknown type of mumps vaccine before 1979 and are at high risk for mumps infection should consider immunization with 2 doses of MMR vaccine. For females of childbearing age, rubella immunity should be determined. If there is no evidence of immunity, females who are not pregnant should be vaccinated. If there is no evidence of immunity, females who are pregnant should delay immunization until after pregnancy. Unvaccinated health care workers born before 50 who lack laboratory evidence of measles, mumps, or rubella immunity or laboratory confirmation of disease should consider measles and mumps immunization with 2 doses of MMR vaccine or rubella immunization with 1 dose of MMR vaccine.  Pneumococcal 13-valent conjugate (PCV13) vaccine. When indicated, a person who is uncertain of his immunization history and has no record of immunization should receive the PCV13 vaccine. All adults  67 years of age and older should receive this vaccine. An adult aged 64 years or older who has certain medical conditions and has not been previously immunized should receive 1 dose of PCV13 vaccine. This PCV13 should be followed with a dose of pneumococcal polysaccharide (PPSV23) vaccine. Adults who are at high risk for pneumococcal disease should obtain the PPSV23 vaccine at least 8 weeks after the dose of PCV13 vaccine. Adults older than 40 years of age who have normal immune system function should obtain the PPSV23 vaccine dose at least 1 year after the dose of PCV13 vaccine.  Pneumococcal polysaccharide (PPSV23) vaccine. When PCV13 is also indicated, PCV13 should be obtained first. All adults aged 75 years and older should be immunized. An adult younger than age 75 years who has certain medical conditions should be immunized. Any person who resides in a nursing home or long-term care facility should be immunized. An adult smoker should be immunized. People with an immunocompromised condition and certain other conditions should receive both PCV13 and PPSV23 vaccines. People with human immunodeficiency virus (HIV) infection should be immunized as soon as possible after diagnosis. Immunization during chemotherapy or radiation therapy should be avoided. Routine use of PPSV23 vaccine is not recommended for American Indians, [place] Natives, or people younger than 65 years unless there are medical conditions that require PPSV23 vaccine. When indicated, people who have unknown immunization and have no record of immunization should receive PPSV23 vaccine. One-time revaccination 5 years after the first dose of PPSV23 is recommended for people aged 19-64 years who have chronic kidney failure, nephrotic syndrome, asplenia, or immunocompromised conditions. People who received 1-2 doses of PPSV23 before age 74 years should receive another dose of PPSV23 vaccine at age 4 years or later if at least 5 years have passed since  the previous dose. Doses of PPSV23 are not needed for people immunized with PPSV23 at or after age 85 years.  Meningococcal vaccine. Adults with asplenia or persistent complement component deficiencies should receive 2 doses of  quadrivalent meningococcal conjugate (MenACWY-D) vaccine. The doses should be obtained at least 2 months apart. Microbiologists working with certain meningococcal bacteria, [place] recruits, people at risk during an outbreak, and people who travel to or live in countries with a high rate of meningitis should be immunized. A first-year college student up through age 61 years who is living in a residence hall should receive a dose if she did not receive a dose on or after her 16th birthday. Adults who have certain high-risk conditions should receive one or more doses of vaccine.  Hepatitis A vaccine. Adults who wish to be protected from this disease, have certain high-risk conditions, work with hepatitis A-infected animals, work in hepatitis A research labs, or travel to or work in countries with a high rate of hepatitis A should be immunized. Adults who were previously unvaccinated and who anticipate close contact with an international adoptee during the first 60 days after arrival in the [place] from a country with a high rate of hepatitis A should be immunized.  Hepatitis B vaccine. Adults who wish to be protected from this disease, have certain high-risk conditions, may be exposed to blood or other infectious body fluids, are household contacts or sex partners of hepatitis B positive people, are clients or workers in certain care facilities, or travel to or work in countries with a high rate of hepatitis B should be immunized.  Haemophilus influenzae type b (Hib) vaccine. A previously unvaccinated person with asplenia or sickle cell disease or having a scheduled splenectomy should receive 1 dose of Hib vaccine. Regardless of previous immunization, a recipient of a  hematopoietic stem cell transplant should receive a 3-dose series 6-12 months after her successful transplant. Hib vaccine is not recommended for adults with HIV infection. Preventive Services / Frequency Ages 39 to 11 years  Blood pressure check.** / Every 3-5 years.  Lipid and cholesterol check.** / Every 5 years beginning at age 6.  Clinical breast exam.** / Every 3 years for women in their 85s and 34s.  BRCA-related cancer risk assessment.** / For women who have family members with a BRCA-related cancer (breast, ovarian, tubal, or peritoneal cancers).  Pap test.** / Every 2 years from ages 37 through 57. Every 3 years starting at age 51 through age 78 or 70 with a history of 3 consecutive normal Pap tests.  HPV screening.** / Every 3 years from ages 29 through ages 20 to 75 with a history of 3 consecutive normal Pap tests.  Hepatitis C blood test.** / For any individual with known risks for hepatitis C.  Skin self-exam. / Monthly.  Influenza vaccine. / Every year.  Tetanus, diphtheria, and acellular pertussis (Tdap, Td) vaccine.** / Consult your health care provider. Pregnant women should receive 1 dose of Tdap vaccine during each pregnancy. 1 dose of Td every 10 years.  Varicella vaccine.** / Consult your health care provider. Pregnant females who do not have evidence of immunity should receive the first dose after pregnancy.  HPV vaccine. / 3 doses over 6 months, if 38 and younger. The vaccine is not recommended for use in pregnant females. However, pregnancy testing is not needed before receiving a dose.  Measles, mumps, rubella (MMR) vaccine.** / You need at least 1 dose of MMR if you were born in 1957 or later. You may also need a 2nd dose. For females of childbearing age, rubella immunity should be determined. If there is no evidence of immunity, females who are not pregnant should be vaccinated.  If there is no evidence of immunity, females who are pregnant should delay  immunization until after pregnancy.  Pneumococcal 13-valent conjugate (PCV13) vaccine.** / Consult your health care provider.  Pneumococcal polysaccharide (PPSV23) vaccine.** / 1 to 2 doses if you smoke cigarettes or if you have certain conditions.  Meningococcal vaccine.** / 1 dose if you are age 33 to 56 years and a Market researcher living in a residence hall, or have one of several medical conditions, you need to get vaccinated against meningococcal disease. You may also need additional booster doses.  Hepatitis A vaccine.** / Consult your health care provider.  Hepatitis B vaccine.** / Consult your health care provider.  Haemophilus influenzae type b (Hib) vaccine.** / Consult your health care provider. Ages 21 to 15 years  Blood pressure check.** / Every year.  Lipid and cholesterol check.** / Every 5 years beginning at age 46 years.  Lung cancer screening. / Every year if you are aged 3-80 years and have a 30-pack-year history of smoking and currently smoke or have quit within the past 15 years. Yearly screening is stopped once you have quit smoking for at least 15 years or develop a health problem that would prevent you from having lung cancer treatment.  Clinical breast exam.** / Every year after age 59 years.  BRCA-related cancer risk assessment.** / For women who have family members with a BRCA-related cancer (breast, ovarian, tubal, or peritoneal cancers).  Mammogram.** / Every year beginning at age 22 years and continuing for as long as you are in good health. Consult with your health care provider.  Pap test.** / Every 3 years starting at age 81 years through age 34 or 46 years with a history of 3 consecutive normal Pap tests.  HPV screening.** / Every 3 years from ages 36 years through ages 57 to 13 years with a history of 3 consecutive normal Pap tests.  Fecal occult blood test (FOBT) of stool. / Every year beginning at age 31 years and continuing until age  65 years. You may not need to do this test if you get a colonoscopy every 10 years.  Flexible sigmoidoscopy or colonoscopy.** / Every 5 years for a flexible sigmoidoscopy or every 10 years for a colonoscopy beginning at age 22 years and continuing until age 22 years.  Hepatitis C blood test.** / For all people born from [birth year] through 1965 and any individual with known risks for hepatitis C.  Skin self-exam. / Monthly.  Influenza vaccine. / Every year.  Tetanus, diphtheria, and acellular pertussis (Tdap/Td) vaccine.** / Consult your health care provider. Pregnant women should receive 1 dose of Tdap vaccine during each pregnancy. 1 dose of Td every 10 years.  Varicella vaccine.** / Consult your health care provider. Pregnant females who do not have evidence of immunity should receive the first dose after pregnancy.  Zoster vaccine.** / 1 dose for adults aged 76 years or older.  Measles, mumps, rubella (MMR) vaccine.** / You need at least 1 dose of MMR if you were born in 1957 or later. You may also need a second dose. For females of childbearing age, rubella immunity should be determined. If there is no evidence of immunity, females who are not pregnant should be vaccinated. If there is no evidence of immunity, females who are pregnant should delay immunization until after pregnancy.  Pneumococcal 13-valent conjugate (PCV13) vaccine.** / Consult your health care provider.  Pneumococcal polysaccharide (PPSV23) vaccine.** / 1 to 2 doses if you smoke cigarettes  or if you have certain conditions.  Meningococcal vaccine.** / Consult your health care provider.  Hepatitis A vaccine.** / Consult your health care provider.  Hepatitis B vaccine.** / Consult your health care provider.  Haemophilus influenzae type b (Hib) vaccine.** / Consult your health care provider. Ages 24 years and over  Blood pressure check.** / Every year.  Lipid and cholesterol check.** / Every 5 years beginning at age 47  years.  Lung cancer screening. / Every year if you are aged 78-80 years and have a 30-pack-year history of smoking and currently smoke or have quit within the past 15 years. Yearly screening is stopped once you have quit smoking for at least 15 years or develop a health problem that would prevent you from having lung cancer treatment.  Clinical breast exam.** / Every year after age 42 years.  BRCA-related cancer risk assessment.** / For women who have family members with a BRCA-related cancer (breast, ovarian, tubal, or peritoneal cancers).  Mammogram.** / Every year beginning at age 20 years and continuing for as long as you are in good health. Consult with your health care provider.  Pap test.** / Every 3 years starting at age 81 years through age 67 or 38 years with 3 consecutive normal Pap tests. Testing can be stopped between 65 and 70 years with 3 consecutive normal Pap tests and no abnormal Pap or HPV tests in the past 10 years.  HPV screening.** / Every 3 years from ages 1 years through ages 70 or 44 years with a history of 3 consecutive normal Pap tests. Testing can be stopped between 65 and 70 years with 3 consecutive normal Pap tests and no abnormal Pap or HPV tests in the past 10 years.  Fecal occult blood test (FOBT) of stool. / Every year beginning at age 63 years and continuing until age 65 years. You may not need to do this test if you get a colonoscopy every 10 years.  Flexible sigmoidoscopy or colonoscopy.** / Every 5 years for a flexible sigmoidoscopy or every 10 years for a colonoscopy beginning at age 16 years and continuing until age 5 years.  Hepatitis C blood test.** / For all people born from [birth year] through 1965 and any individual with known risks for hepatitis C.  Osteoporosis screening.** / A one-time screening for women ages 70 years and over and women at risk for fractures or osteoporosis.  Skin self-exam. / Monthly.  Influenza vaccine. / Every year.  Tetanus,  diphtheria, and acellular pertussis (Tdap/Td) vaccine.** / 1 dose of Td every 10 years.  Varicella vaccine.** / Consult your health care provider.  Zoster vaccine.** / 1 dose for adults aged 4 years or older.  Pneumococcal 13-valent conjugate (PCV13) vaccine.** / Consult your health care provider.  Pneumococcal polysaccharide (PPSV23) vaccine.** / 1 dose for all adults aged 64 years and older.  Meningococcal vaccine.** / Consult your health care provider.  Hepatitis A vaccine.** / Consult your health care provider.  Hepatitis B vaccine.** / Consult your health care provider.  Haemophilus influenzae type b (Hib) vaccine.** / Consult your health care provider. ** Family history and personal history of risk and conditions may change your health care provider's recommendations.   This information is not intended to replace advice given to you by your health care provider. Make sure you discuss any questions you have with your health care provider.   Document Released: 04/29/2001 Document Revised: 03/24/2014 Document Reviewed: 07/29/2010 Elsevier Interactive Patient Education [employer].  Hives Hives are itchy, red, swollen areas of the skin. They can vary in size and location on your body. Hives can come and go for hours or several days (acute hives) or for several weeks (chronic hives). Hives do not spread from person to person (noncontagious). They may get worse with scratching, exercise, and emotional stress. CAUSES   Allergic reaction to food, additives, or drugs.  Infections, including the common cold.  Illness, such as vasculitis, lupus, or thyroid disease.  Exposure to sunlight, heat, or cold.  Exercise.  Stress.  Contact with chemicals. SYMPTOMS   Red or white swollen patches on the skin. The patches may change size, shape, and location quickly and repeatedly.  Itching.  Swelling of the hands, feet, and face. This may occur if hives develop deeper in  the skin. DIAGNOSIS  Your caregiver can usually tell what is wrong by performing a physical exam. Skin or blood tests may also be done to determine the cause of your hives. In some cases, the cause cannot be determined. TREATMENT  Mild cases usually get better with medicines such as antihistamines. Severe cases may require an emergency epinephrine injection. If the cause of your hives is known, treatment includes avoiding that trigger.  HOME CARE INSTRUCTIONS   Avoid causes that trigger your hives.  Take antihistamines as directed by your caregiver to reduce the severity of your hives. Non-sedating or low-sedating antihistamines are usually recommended. Do not drive while taking an antihistamine.  Take any other medicines prescribed for itching as directed by your caregiver.  Wear loose-fitting clothing.  Keep all follow-up appointments as directed by your caregiver. SEEK MEDICAL CARE IF:   You have persistent or severe itching that is not relieved with medicine.  You have painful or swollen joints. SEEK IMMEDIATE MEDICAL CARE IF:   You have a fever.  Your tongue or lips are swollen.  You have trouble breathing or swallowing.  You feel tightness in the throat or chest.  You have abdominal pain. These problems may be the first sign of a life-threatening allergic reaction. Call your local emergency services (911 in [place] MAKE SURE YOU:   Understand these instructions.  Will watch your condition.  Will get help right away if you are not doing well or get worse.   This information is not intended to replace advice given to you by your health care provider. Make sure you discuss any questions you have with your health care provider.   Document Released: 03/03/2005 Document Revised: 03/08/2013 Document Reviewed: 05/27/2011 Elsevier Interactive Patient Education [employer].

## 2015-08-29 LAB — CBC
HEMATOCRIT: 42 % (ref 36.0–46.0)
Hemoglobin: 13.8 g/dL (ref 12.0–15.0)
MCHC: 33 g/dL (ref 30.0–36.0)
MCV: 86.1 fl (ref 78.0–100.0)
PLATELETS: 254 10*3/uL (ref 150.0–400.0)
RBC: 4.88 Mil/uL (ref 3.87–5.11)
RDW: 15 % (ref 11.5–15.5)
WBC: 7.9 10*3/uL (ref 4.0–10.5)

## 2015-08-29 LAB — COMPREHENSIVE METABOLIC PANEL
ALBUMIN: 4.4 g/dL (ref 3.5–5.2)
ALT: 15 U/L (ref 0–35)
AST: 17 U/L (ref 0–37)
Alkaline Phosphatase: 56 U/L (ref 39–117)
BILIRUBIN TOTAL: 0.4 mg/dL (ref 0.2–1.2)
BUN: 11 mg/dL (ref 6–23)
CALCIUM: 9.5 mg/dL (ref 8.4–10.5)
CHLORIDE: 102 meq/L (ref 96–112)
CO2: 28 meq/L (ref 19–32)
CREATININE: 0.74 mg/dL (ref 0.40–1.20)
GFR: 92.52 mL/min (ref >60.00)
Glucose, Bld: 82 mg/dL (ref 70–99)
Potassium: 3.9 mEq/L (ref 3.5–5.1)
Sodium: 137 mEq/L (ref 135–145)
Total Protein: 8.1 g/dL (ref 6.0–8.3)

## 2015-08-29 LAB — LIPID PANEL
CHOL/HDL RATIO: 3
CHOLESTEROL: 156 mg/dL (ref 0–200)
HDL: 46.7 mg/dL (ref >39.00)
LDL CALC: 91 mg/dL (ref 0–99)
NonHDL: 109.25
TRIGLYCERIDES: 89 mg/dL (ref 0.0–149.0)
VLDL: 17.8 mg/dL (ref 0.0–40.0)

## 2015-08-29 LAB — T4, FREE: FREE T4: 0.85 ng/dL (ref 0.60–1.60)

## 2015-08-29 LAB — HIV ANTIBODY (ROUTINE TESTING W REFLEX): HIV 1&2 Ab, 4th Generation: NONREACTIVE

## 2015-09-09 NOTE — Assessment & Plan Note (Signed)
Encouraged heart healthy diet, increase exercise, avoid trans fats, consider a krill oil cap daily 

## 2015-09-09 NOTE — Assessment & Plan Note (Signed)
Patient encouraged to maintain heart healthy diet, regular exercise, adequate sleep. Consider daily probiotics. Take medications as prescribed 

## 2015-09-09 NOTE — Progress Notes (Addendum)
Subjective:    Patient ID: Maria GibneyMelissa Ramos Casey, female    DOB: 07-31-1975, 40 y.o.   MRN: [id number]  Chief Complaint  Patient presents with  . Annual Exam    HPI Patient is in today for annual exam and follow up on health concerns. She has been under a great of stress, [place], her husband has been traveling, her son continues to need surgeries and she is working and has just finished school. She acknowledges she is having epoisodes of panic with sob and chest tightness. Even some intermittent RUQ spasm. Denies CP/SOB/HA/congestion/fevers/GI or GU c/o. Taking meds as prescribed with her increased stressors she does continue to have intermittent palpitations.  Past Medical History  Diagnosis Date  . Palpitations   . SOB (shortness of breath)   . Chest pain   . History of chicken pox   . Menorrhagia 10/24/2012  . Preventative health care 10/24/2012  . Other and unspecified hyperlipidemia 10/24/2012  . Anxiety state, unspecified 10/24/2012  . Neck strain 11/28/2012  . Unspecified constipation 06/01/2013  . FH: diabetes mellitus 06/01/2013  . Low back pain 11/28/2012  . Raynaud phenomenon 06/15/2013  . Overweight(278.02) 08/07/2013  . Palpitations 09/13/2015    Past Surgical History  Procedure Laterality Date  . Dilitation & currettage/hystroscopy with versapoint resection N/A 10/11/2012    Procedure: DILATATION & CURETTAGE/HYSTEROSCOPY WITH VERSAPOINT RESECTION;  Surgeon: Genia DelMarie-Lyne Lavoie, MD;  Location: [facility];  Service: Gynecology;  Laterality: N/A;  endometrial polyp  . Tubal ligation    . Cesarean section      x 3    Family History  Problem Relation Age of Onset  . Heart attack Paternal Grandfather   . Hypertension Father   . Depression Sister   . Heart disease Son     coarctation of aorta repaired, Ross procedure, pulmonary graft, VSD repair    Social History   Social History  . Marital Status: Married    Spouse Name: N/A  . Number of  Children: 3  . Years of Education: N/A   Occupational History  . NURSING SECRETARY  [employer]   Social History Main Topics  . Smoking status: Never Smoker   . Smokeless tobacco: Never Used  . Alcohol Use: No  . Drug Use: No  . Sexual Activity: Yes   Other Topics Concern  . Not on file   Social History Narrative    Outpatient Prescriptions Prior to Visit  Medication Sig Dispense Refill  . Multiple Vitamin (MULTIVITAMIN) tablet Take 1 tablet by mouth daily. Thrive Nutritional capsule & shakes    . triamcinolone ointment (KENALOG) 0.5 % Apply 1 application topically 2 (two) times daily. 30 g 0   No facility-administered medications prior to visit.    No Known Allergies  Review of Systems  Constitutional: Negative for fever, chills and malaise/fatigue.  HENT: Negative for congestion and hearing loss.   Eyes: Negative for discharge.  Respiratory: Positive for shortness of breath. Negative for cough and sputum production.   Cardiovascular: Negative for chest pain, palpitations and leg swelling.  Gastrointestinal: Positive for abdominal pain. Negative for heartburn, nausea, vomiting, diarrhea, constipation and blood in stool.  Genitourinary: Negative for dysuria, urgency, frequency and hematuria.  Musculoskeletal: Negative for myalgias, back pain and falls.  Skin: Negative for rash.  Neurological: Negative for dizziness, sensory change, loss of consciousness, weakness and headaches.  Endo/Heme/Allergies: Negative for environmental allergies. Does not bruise/bleed easily.  Psychiatric/Behavioral: Positive for depression. Negative for  suicidal ideas, hallucinations and substance abuse. The patient is nervous/anxious. The patient does not have insomnia.        Objective:    Physical Exam  Constitutional: She is oriented to person, place, and time. She appears well-developed and well-nourished. No distress.  HENT:  Head: Normocephalic and atraumatic.  Nose: Nose normal.    Eyes: Right eye exhibits no discharge. Left eye exhibits no discharge.  Neck: Normal range of motion. Neck supple.  Cardiovascular: Normal rate and regular rhythm.   No murmur heard. Pulmonary/Chest: Effort normal and breath sounds normal.  Abdominal: Soft. Bowel sounds are normal. There is no tenderness.  Musculoskeletal: She exhibits no edema.  Neurological: She is alert and oriented to person, place, and time.  Skin: Skin is warm and dry.  Psychiatric: She has a normal mood and affect.  Nursing note and vitals reviewed.   BP 108/72 mmHg  Pulse 88  Temp(Src) 98.5 F (36.9 C) (Oral)  Ht 5\' 4"  (1.626 m)  Wt 169 lb 6 oz (76.828 kg)  BMI 29.06 kg/m2  SpO2 96% Wt Readings from Last 3 Encounters:  08/28/15 169 lb 6 oz (76.828 kg)  02/26/15 169 lb 6 oz (76.828 kg)  11/27/14 164 lb 4 oz (74.503 kg)     Lab Results  Component Value Date   WBC 7.9 08/28/2015   HGB 13.8 08/28/2015   HCT 42.0 08/28/2015   PLT 254.0 08/28/2015   GLUCOSE 82 08/28/2015   CHOL 156 08/28/2015   TRIG 89.0 08/28/2015   HDL 46.70 08/28/2015   LDLCALC 91 08/28/2015   ALT 15 08/28/2015   AST 17 08/28/2015   NA 137 08/28/2015   K 3.9 08/28/2015   CL 102 08/28/2015   CREATININE 0.74 08/28/2015   BUN 11 08/28/2015   CO2 28 08/28/2015   TSH 3.39 02/26/2015   HGBA1C 5.7 05/19/2013    Lab Results  Component Value Date   TSH 3.39 02/26/2015   Lab Results  Component Value Date   WBC 7.9 08/28/2015   HGB 13.8 08/28/2015   HCT 42.0 08/28/2015   MCV 86.1 08/28/2015   PLT 254.0 08/28/2015   Lab Results  Component Value Date   NA 137 08/28/2015   K 3.9 08/28/2015   CO2 28 08/28/2015   GLUCOSE 82 08/28/2015   BUN 11 08/28/2015   CREATININE 0.74 08/28/2015   BILITOT 0.4 08/28/2015   ALKPHOS 56 08/28/2015   AST 17 08/28/2015   ALT 15 08/28/2015   PROT 8.1 08/28/2015   ALBUMIN 4.4 08/28/2015   CALCIUM 9.5 08/28/2015   ANIONGAP 6 11/01/2014   GFR 92.52 08/28/2015   Lab Results  Component  Value Date   CHOL 156 08/28/2015   Lab Results  Component Value Date   HDL 46.70 08/28/2015   Lab Results  Component Value Date   LDLCALC 91 08/28/2015   Lab Results  Component Value Date   TRIG 89.0 08/28/2015   Lab Results  Component Value Date   CHOLHDL 3 08/28/2015   Lab Results  Component Value Date   HGBA1C 5.7 05/19/2013       Assessment & Plan:   Problem List Items Addressed This Visit    Preventative health care    Patient encouraged to maintain heart healthy diet, regular exercise, adequate sleep. Consider daily probiotics. Take medications as prescribed      Relevant Orders   CBC (Completed)   Lipid panel (Completed)   Comprehensive metabolic panel (Completed)   HIV antibody (with  reflex) (Completed)   Hepatitis C Antibody (Completed)   T4, free (Completed)   Palpitations    Infrequent and no associated symptoms. Likely related to stress but will check labs to rule out organic cause      Constipation    Doing well increase hydration, fiber and probiotics.       Anxiety state    Is under a great deal of stress with ill children, moving to [place] next week, husband traveling etc. She realizes she should start a daily SSRI and may use Alprazolam prn.      Relevant Medications   escitalopram (LEXAPRO) 10 MG tablet   ALPRAZolam (XANAX) 0.25 MG tablet    Other Visit Diagnoses    Anxiety and depression    -  Primary    Relevant Medications    escitalopram (LEXAPRO) 10 MG tablet    ALPRAZolam (XANAX) 0.25 MG tablet    Other Relevant Orders    CBC (Completed)    Lipid panel (Completed)    Comprehensive metabolic panel (Completed)    HIV antibody (with reflex) (Completed)    Hepatitis C Antibody (Completed)    T4, free (Completed)    History of occupational exposure to risk factor        Relevant Orders    CBC (Completed)    Lipid panel (Completed)    Comprehensive metabolic panel (Completed)    HIV antibody (with reflex) (Completed)     Hepatitis C Antibody (Completed)    T4, free (Completed)    Excessive sweating        Relevant Orders    CBC (Completed)    Lipid panel (Completed)    Comprehensive metabolic panel (Completed)    HIV antibody (with reflex) (Completed)    Hepatitis C Antibody (Completed)    T4, free (Completed)       I am having Maria Casey start on escitalopram and ALPRAZolam. I am also having her maintain her multivitamin and triamcinolone ointment.  Meds ordered this encounter  Medications  . escitalopram (LEXAPRO) 10 MG tablet    Sig: Take 1 tablet (10 mg total) by mouth daily.    Dispense:  30 tablet    Refill:  5  . ALPRAZolam (XANAX) 0.25 MG tablet    Sig: Take 1 tablet (0.25 mg total) by mouth 2 (two) times daily as needed for anxiety.    Dispense:  20 tablet    Refill:  0     Danise Edge, MDPatient ID: Maria Casey, female   DOB: [birth year], 40 y.o.   MRN: [id number]

## 2015-09-09 NOTE — Assessment & Plan Note (Signed)
Doing well increase hydration, fiber and probiotics.

## 2015-09-09 NOTE — Assessment & Plan Note (Signed)
Is under a great deal of stress with ill children, [place], husband traveling etc. She realizes she should start a daily SSRI and may use Alprazolam prn.

## 2015-09-13 ENCOUNTER — Encounter: Payer: Self-pay | Admitting: Family Medicine

## 2015-09-13 DIAGNOSIS — R002 Palpitations: Secondary | ICD-10-CM

## 2015-09-13 HISTORY — DX: Palpitations: R00.2

## 2015-09-13 NOTE — Addendum Note (Signed)
Addended by: Danise EdgeBLYTH, STACEY A on: 09/13/2015 08:03 AM   Modules accepted: K[employer] BroodSmartSet

## 2015-09-13 NOTE — Assessment & Plan Note (Signed)
Infrequent and no associated symptoms. Likely related to stress but will check labs to rule out organic cause

## 2015-09-17 ENCOUNTER — Ambulatory Visit: Payer: Commercial Managed Care - PPO | Admitting: Family Medicine
